# Patient Record
Sex: Female | Born: 1985 | Race: Asian | Hispanic: No | Marital: Single | Smoking: Never smoker
Health system: Southern US, Community
[De-identification: ages and names within clinical notes are randomized; demographics above are authoritative.]

## PROBLEM LIST (undated history)

## (undated) DIAGNOSIS — K2970 Gastritis, unspecified, without bleeding: Secondary | ICD-10-CM

## (undated) DIAGNOSIS — Z7722 Contact with and (suspected) exposure to environmental tobacco smoke (acute) (chronic): Secondary | ICD-10-CM

## (undated) DIAGNOSIS — R1013 Epigastric pain: Secondary | ICD-10-CM

## (undated) DIAGNOSIS — J309 Allergic rhinitis, unspecified: Secondary | ICD-10-CM

## (undated) DIAGNOSIS — K219 Gastro-esophageal reflux disease without esophagitis: Secondary | ICD-10-CM

## (undated) HISTORY — DX: Gastro-esophageal reflux disease without esophagitis: K21.9

## (undated) HISTORY — DX: Allergic rhinitis, unspecified: J30.9

## (undated) HISTORY — DX: Contact with and (suspected) exposure to environmental tobacco smoke (acute) (chronic): Z77.22

## (undated) HISTORY — PX: TOOTH EXTRACTION: SUR596

## (undated) HISTORY — DX: Gastritis, unspecified, without bleeding: K29.70

## (undated) HISTORY — DX: Epigastric pain: R10.13

---
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
## 2003-05-10 ENCOUNTER — Encounter: Payer: Self-pay | Admitting: Obstetrics and Gynecology

## 2003-05-10 ENCOUNTER — Ambulatory Visit (HOSPITAL_COMMUNITY): Admission: RE | Admit: 2003-05-10 | Discharge: 2003-05-10 | Payer: Self-pay | Admitting: Obstetrics and Gynecology

## 2003-06-06 ENCOUNTER — Encounter: Payer: Self-pay | Admitting: Obstetrics and Gynecology

## 2003-06-06 ENCOUNTER — Ambulatory Visit (HOSPITAL_COMMUNITY): Admission: RE | Admit: 2003-06-06 | Discharge: 2003-06-06 | Payer: Self-pay | Admitting: Obstetrics and Gynecology

## 2003-09-07 ENCOUNTER — Encounter: Admission: RE | Admit: 2003-09-07 | Discharge: 2003-09-07 | Payer: Self-pay | Admitting: Obstetrics and Gynecology

## 2003-09-11 ENCOUNTER — Encounter: Admission: RE | Admit: 2003-09-11 | Discharge: 2003-09-11 | Payer: Self-pay | Admitting: Obstetrics and Gynecology

## 2003-09-14 ENCOUNTER — Encounter: Admission: RE | Admit: 2003-09-14 | Discharge: 2003-09-14 | Payer: Self-pay | Admitting: Family Medicine

## 2003-09-18 ENCOUNTER — Encounter: Admission: RE | Admit: 2003-09-18 | Discharge: 2003-09-18 | Payer: Self-pay | Admitting: Obstetrics and Gynecology

## 2003-09-21 ENCOUNTER — Encounter: Admission: RE | Admit: 2003-09-21 | Discharge: 2003-09-21 | Payer: Self-pay | Admitting: *Deleted

## 2003-09-25 ENCOUNTER — Encounter: Admission: RE | Admit: 2003-09-25 | Discharge: 2003-09-25 | Payer: Self-pay | Admitting: Obstetrics and Gynecology

## 2003-09-26 ENCOUNTER — Inpatient Hospital Stay (HOSPITAL_COMMUNITY): Admission: AD | Admit: 2003-09-26 | Discharge: 2003-09-29 | Payer: Self-pay | Admitting: Internal Medicine

## 2004-10-23 ENCOUNTER — Emergency Department (HOSPITAL_COMMUNITY): Admission: EM | Admit: 2004-10-23 | Discharge: 2004-10-23 | Payer: Self-pay | Admitting: Family Medicine

## 2005-04-24 ENCOUNTER — Emergency Department (HOSPITAL_COMMUNITY): Admission: EM | Admit: 2005-04-24 | Discharge: 2005-04-24 | Payer: Self-pay | Admitting: Family Medicine

## 2005-05-13 ENCOUNTER — Emergency Department (HOSPITAL_COMMUNITY): Admission: EM | Admit: 2005-05-13 | Discharge: 2005-05-13 | Payer: Self-pay | Admitting: Family Medicine

## 2008-12-16 ENCOUNTER — Emergency Department (HOSPITAL_COMMUNITY): Admission: EM | Admit: 2008-12-16 | Discharge: 2008-12-16 | Payer: Self-pay | Admitting: Emergency Medicine

## 2010-02-27 ENCOUNTER — Ambulatory Visit: Payer: Self-pay | Admitting: Physician Assistant

## 2010-02-27 DIAGNOSIS — J309 Allergic rhinitis, unspecified: Secondary | ICD-10-CM | POA: Insufficient documentation

## 2010-03-14 ENCOUNTER — Ambulatory Visit: Payer: Self-pay | Admitting: Physician Assistant

## 2010-03-14 LAB — CONVERTED CEMR LAB
Cholesterol: 162 mg/dL (ref 0–200)
HDL: 81 mg/dL (ref >39)
LDL Cholesterol: 60 mg/dL (ref 0–99)
Triglycerides: 103 mg/dL (ref <150)

## 2010-03-15 ENCOUNTER — Encounter: Payer: Self-pay | Admitting: Physician Assistant

## 2010-04-16 ENCOUNTER — Ambulatory Visit: Payer: Self-pay | Admitting: Physician Assistant

## 2010-04-16 ENCOUNTER — Telehealth: Payer: Self-pay | Admitting: Physician Assistant

## 2010-08-20 ENCOUNTER — Emergency Department (HOSPITAL_COMMUNITY)
Admission: EM | Admit: 2010-08-20 | Discharge: 2010-08-20 | Payer: Self-pay | Source: Home / Self Care | Admitting: Emergency Medicine

## 2010-10-29 ENCOUNTER — Encounter (INDEPENDENT_AMBULATORY_CARE_PROVIDER_SITE_OTHER): Payer: Self-pay | Admitting: Internal Medicine

## 2010-10-29 DIAGNOSIS — R51 Headache: Secondary | ICD-10-CM | POA: Insufficient documentation

## 2010-10-29 DIAGNOSIS — R519 Headache, unspecified: Secondary | ICD-10-CM | POA: Insufficient documentation

## 2010-12-31 NOTE — Letter (Signed)
Summary: PT INFORMATION SHEET  PT INFORMATION SHEET   Imported By: Arta Bruce 05/03/2010 14:58:18  _____________________________________________________________________  External Attachment:    Type:   Image     Comment:   External Document

## 2010-12-31 NOTE — Assessment & Plan Note (Signed)
Summary: new medicaid pt/ first est care/ sinus infection//gk   Vital Signs:  Patient profile:   25 year old female Weight:      115.19 pounds Temp:     97.3 degrees F Pulse rate:   89 / minute Pulse rhythm:   regular Resp:     16 per minute BP sitting:   111 / 77  (right arm) Cuff size:   regular  Vitals Entered By: Chauncy Passy, SMA  CC: NP- Pt. is here for a poss. sinus infection. Pt. states her ears are buzzing and sometimes hurt. Pt. states she has pain under her eyes that will radiate towards the back of her head. This has been present for about a year or more. Also, having hard time breathing when sleeping.  Is Patient Diabetic? No Pain Assessment Patient in pain? yes     Location: Head/Ears Intensity: 7/6 Type: sharp  Does patient need assistance? Functional Status Self care Ambulation Normal   Primary Care Provider:  Tereso Newcomer, PA-C  CC:  NP- Pt. is here for a poss. sinus infection. Pt. states her ears are buzzing and sometimes hurt. Pt. states she has pain under her eyes that will radiate towards the back of her head. This has been present for about a year or more. Also and having hard time breathing when sleeping. Marland Kitchen  History of Present Illness: New patient. No regular medical care in the past.  Health maint: Last pap done with GYN (Dr. Tracey Harries).  Thinks she has sinus infection.   Went to urgent care 1 1/2 mos ago.   She was given several prescriptions. . . somewhat vague about her hx. Sounds like she had antibx's.    Has had symptoms for over a year. Headache posterior. + frontal sinus pressure Has daily symptoms + nasal congestion No fever No sneezing + itchy eyes + sore throat + has to clear throat + cough - mainly nonproductive NO hemoptysis  Very vague about symptoms.  When asked how long she has had these symptoms, she says, "I don't know." Sounds like she has had problems since she moved to [place] from [place].  Habits &  Providers  Alcohol-Tobacco-Diet     Tobacco Status: never  Exercise-Depression-Behavior     Drug Use: no  Current Medications (verified): 1)  None  Allergies (verified): No Known Drug Allergies  Past History:  Past Medical History: Unremarkable  Past Surgical History: Denies surgical history  Family History: Family History Hypertension - Dad  Social History: G1P1 1 child Never Smoked Occ ETOH Drug use-no Unemployed From [employer] Status:  never Drug Use:  no  Review of Systems      See HPI CV:  Denies shortness of breath with exertion. Resp:  Denies coughing up blood and wheezing.  Physical Exam  General:  alert, well-developed, and well-nourished.   Head:  normocephalic and atraumatic.   Eyes:  pupils equal, pupils round, pupils reactive to light, and no optic disk abnormalities.   Ears:  R ear normal and L ear normal.   Nose:  no nasal polyps, mucosal erythema, and mucosal edema.   Mouth:  pharynx pink and moist, no erythema, and no exudates.   Neck:  supple, no thyromegaly, and no cervical lymphadenopathy.   Lungs:  normal breath sounds, no crackles, and no wheezes.   Heart:  normal rate and regular rhythm.   Extremities:  no edema Neurologic:  alert & oriented X3 and cranial nerves II-XII intact.  Skin:  turgor normal.   Psych:  normally interactive.     Impression & Recommendations:  Problem # 1:  ALLERGIC RHINITIS (ICD-477.9)  sounds like she has gone back and forth to urgent cares and has had several rounds of antibiotics I do not think she needs antibx's at this time I think she has significant allergic rhinitis she has not really followed through with the treatment that has been rx'd to her in the past I advised her to get on an antihistamine/decongestant combo + flonase and she should take daily  she should use hypoallergenic soaps and detergents and avoid animal fur in her bed linens/pillows. f/u in 6-8 weeks if no improvement, may  need to consider referral to ENT or allergy for allergy testing also, consider sinus CT if no improvement   Her updated medication list for this problem includes:    Flonase 50 Mcg/act Susp (Fluticasone propionate) .Marland Kitchen... 2 sprays each nostril once daily (use for 3 weeks, then take one week off each month)  Problem # 2:  Preventive Health Care (ICD-V70.0) bring back for cholesterol screen update Td today has GYN exams with Ob/Gyn  Complete Medication List: 1)  Zyrtec-d Allergy & Congestion 5-120 Mg Xr12h-tab (Cetirizine-pseudoephedrine) .... Take 1 tablet by mouth two times a day 2)  Flonase 50 Mcg/act Susp (Fluticasone propionate) .... 2 sprays each nostril once daily (use for 3 weeks, then take one week off each month)  Other Orders: Tdap => 56yrs IM [phone]) Admin 1st Vaccine (60454) Admin 1st Vaccine [facility]) [phone])  Patient Instructions: 1)  Td shot today. 2)  Schedule lab visit for fasting lipids.  Do not eat or drink anything after midnight the night before except water. 3)  Please schedule a follow-up appointment in 6-8 weeks with Lorin Picket for allergies or return sooner if you feel worse. 4)  Use ibuprofen for 3-4 days straight, then as needed. 5)  Take 400-600 mg of Ibuprofen (Advil, Motrin) with food every 8 hours as needed  for relief of pain or comfort of fever.  6)  You can also use tylenol as needed. 7)  Take 650 - 1000 mg of tylenol every 4-6 hours as needed for relief of pain or comfort of fever. Avoid taking more than 4000 mg in a 24 hour period( can cause liver damage in higher doses).  Prescriptions: FLONASE 50 MCG/ACT SUSP (FLUTICASONE PROPIONATE) 2 sprays each nostril once daily (use for 3 weeks, then take one week off each month)  #1 bottle x 3   Entered and Authorized by:   Tereso Newcomer PA-C   Signed by:   Tereso Newcomer PA-C on 02/27/2010   Method used:   Print then Give to Patient   RxID:   [id number] ZYRTEC-D ALLERGY & CONGESTION 5-120 MG XR12H-TAB  (CETIRIZINE-PSEUDOEPHEDRINE) Take 1 tablet by mouth two times a day  #60 x 3   Entered and Authorized by:   Tereso Newcomer PA-C   Signed by:   Tereso Newcomer PA-C on 02/27/2010   Method used:   Print then Give to Patient   RxID:   [id number]    Tetanus/Td Vaccine    Vaccine Type: Tdap    Site: left deltoid    Mfr: Sanofi Pasteur    Dose: 0.5 ml    Route: IM    Given by: Chauncy Passy, SMA    Exp. Date: 03/07/2012    Lot #: [id number]    VIS given: 10/19/07 version given February 27, 2010.

## 2010-12-31 NOTE — Letter (Signed)
Summary: *HSN Results Follow up  [facility]  [address] [place], [place] [address]   Phone: [phone]  Fax: [phone]      03/15/2010   AN H [address] [place], [place]  [address]   Dear  Ms. AN H,                            ____S.Drinkard,FNP   ____D. Gore,FNP       ____B. McPherson,MD   ____V. Rankins,MD    ____E. Mulberry,MD    ____N. Daphine Deutscher, FNP  ____D. Reche Dixon, MD    ____K. Philipp Deputy, MD    __x__S. Alben Spittle, PA-C     This letter is to inform you that your recent test(s):  _______Pap Smear    ___x____Lab Test     _______X-ray    ___x____ is within acceptable limits  _______ requires a medication change  _______ requires a follow-up lab visit  _______ requires a follow-up visit with your provider   Comments:       _________________________________________________________ If you have any questions, please contact our office                     Sincerely,  Tereso Newcomer PA-C [facility]

## 2010-12-31 NOTE — Assessment & Plan Note (Signed)
Summary: 6-8 WEEK FU////KT   Vital Signs:  Patient profile:   25 year old female Weight:      116 pounds Temp:     98.2 degrees F oral Pulse rate:   75 / minute Resp:     19 per minute BP sitting:   115 / 76  (left arm) CC: follow-up visit, no other issue, sinus issue not getting better, med. not helping, stay stopped-up Is Patient Diabetic? No Pain Assessment Patient in pain? no       Does patient need assistance? Functional Status Self care Ambulation Normal   Primary Care Provider:  Tereso Newcomer, PA-C  CC:  follow-up visit, no other issue, sinus issue not getting better, med. not helping, and stay stopped-up.  History of Present Illness: Here for f/u. Taking Zyrtec-D and Flonase every day. States no relief.  Still has a lot of sinus pressure and posterior headache.  C/o tinnitus in both ears.  Notes some pressure in ears.  Does note sore throat.  No smoking.  Feels like she gets choked at times.  No dysphagia.  No dyspnea.  Still has a lot of nasal congestion.  No dental pain.  No change in taste or smell.  No vertiginous symptoms.  No loss of hearing.  On f/u of hx from first visit, she notes being given antibiotics once or twice.  She did take them all and never felt any better with them.   Problems Prior to Update: 1)  Preventive Health Care  (ICD-V70.0) 2)  Allergic Rhinitis  (ICD-477.9)  Current Medications (verified): 1)  Zyrtec-D Allergy & Congestion 5-120 Mg Xr12h-Tab (Cetirizine-Pseudoephedrine) .... Take 1 Tablet By Mouth Two Times A Day 2)  Flonase 50 Mcg/act Susp (Fluticasone Propionate) .... 2 Sprays Each Nostril Once Daily (Use For 3 Weeks, Then Take One Week Off Each Month)  Allergies (verified): No Known Drug Allergies  Physical Exam  General:  alert, well-developed, and well-nourished.   Head:  normocephalic and atraumatic.   Eyes:  pupils equal, pupils round, and pupils reactive to light.   Ears:  R ear normal and L ear normal.   Nose:  no  external deformity, no nasal discharge, and mucosal edema.   Mouth:  pharynx pink and moist, no erythema, and no exudates.   Neck:  supple and no cervical lymphadenopathy.   Lungs:  normal breath sounds, no crackles, and no wheezes.   Heart:  normal rate and regular rhythm.   Neurologic:  alert & oriented X3 and cranial nerves II-XII intact.   Psych:  normally interactive.     Impression & Recommendations:  Problem # 1:  ALLERGIC RHINITIS (ICD-477.9)  significant symptoms of congestion do not feel she needs antibiotics at this point she had multiple rounds of antibiotics before I saw her and she did not improve she is now on Zyrtec-D and flonase without change I do not believe she is describing anything else (ie . Marland Kitchen .I do not think she has migraines) refer to ENT will get her to use saline nose spray and mucinex to see if this helps  Her updated medication list for this problem includes:    Flonase 50 Mcg/act Susp (Fluticasone propionate) .Marland Kitchen... 2 sprays each nostril once daily (use for 3 weeks, then take one week off each month)    Cvs Saline Nose Spray 0.65 % Soln (Saline) ..... Use as needed . . . do not use at same time as flonase  Orders: ENT Referral (ENT)  Complete  Medication List: 1)  Zyrtec-d Allergy & Congestion 5-120 Mg Xr12h-tab (Cetirizine-pseudoephedrine) .... Take 1 tablet by mouth two times a day 2)  Flonase 50 Mcg/act Susp (Fluticasone propionate) .... 2 sprays each nostril once daily (use for 3 weeks, then take one week off each month) 3)  Cvs Saline Nose Spray 0.65 % Soln (Saline) .... Use as needed . . . do not use at same time as flonase 4)  Mucinex 600 Mg Xr12h-tab (Guaifenesin) .... Take 1 tablet by mouth two times a day  Patient Instructions: 1)  Get Mucinex and Saline Nose Spray over the counter. 2)  Someone will call you about an appointment with the ENT.

## 2010-12-31 NOTE — Progress Notes (Signed)
Summary: ENT referral  Phone Note Outgoing Call   Summary of Call: Needs ENT referral for sinusitis.  Letter in system. Initial call taken by: Brynda Rim,  Apr 16, 2010 4:24 PM

## 2010-12-31 NOTE — Letter (Signed)
Summary: *Referral Letter  [facility]  [address] [place], [place] [address]   Phone: [phone]  Fax: [phone]    04/16/2010  Thank you in advance for agreeing to see my patient:  An H [address] [place], [place]  [address]  Phone: [phone]  Reason for Referral: 25 yo female with allergic rhinitis and sinus congestion.  She has been on multiple rounds of antibiotics before establishing with me with no improvement.  I put her on Zyrtec-D and Flonase.  She has had no benfit from this and continues to have significant sinus pain and pressure, tinnitus and headaches.  Please evaluate.  Procedures Requested:   Current Medical Problems: 1)  PREVENTIVE HEALTH CARE (ICD-V70.0) 2)  ALLERGIC RHINITIS (ICD-477.9)   Current Medications: 1)  ZYRTEC-D ALLERGY & CONGESTION 5-120 MG XR12H-TAB (CETIRIZINE-PSEUDOEPHEDRINE) Take 1 tablet by mouth two times a day 2)  FLONASE 50 MCG/ACT SUSP (FLUTICASONE PROPIONATE) 2 sprays each nostril once daily (use for 3 weeks, then take one week off each month) 3)  CVS SALINE NOSE SPRAY 0.65 % SOLN (SALINE) use as needed . . . do not use at same time as Flonase 4)  MUCINEX 600 MG XR12H-TAB (GUAIFENESIN) Take 1 tablet by mouth two times a day   Past Medical History: 1)  Unremarkable   Prior History of Blood Transfusions:   Pertinent Labs:    Thank you again for agreeing to see our patient; please contact us if you have any further questions or need additional information.  Sincerely,  Tereso Newcomer PA-C

## 2011-01-27 ENCOUNTER — Encounter (INDEPENDENT_AMBULATORY_CARE_PROVIDER_SITE_OTHER): Payer: Self-pay | Admitting: Internal Medicine

## 2011-02-06 NOTE — Letter (Signed)
Summary: [facility]   ENT   Imported By: Arta Bruce 01/28/2011 10:43:10  _____________________________________________________________________  External Attachment:    Type:   Image     Comment:   External Document

## 2011-02-06 NOTE — Miscellaneous (Signed)
Summary: [facility] ENT record review  Clinical Lists Changes  Problems: Added new problem of HEADACHE (ICD-784.0) - Evaluated by [facility] ENT, Aquilla Hacker, PA-C.  Musculoskeletal vs. Neurologic.   Recommended referral to Neurology--pt to let her know. No imaging as pt pregnant

## 2011-02-13 LAB — URINALYSIS, ROUTINE W REFLEX MICROSCOPIC
Hgb urine dipstick: NEGATIVE
Ketones, ur: NEGATIVE mg/dL
Protein, ur: NEGATIVE mg/dL
Urobilinogen, UA: 0.2 mg/dL (ref 0.0–1.0)
pH: 7.5 (ref 5.0–8.0)

## 2011-02-13 LAB — URINE MICROSCOPIC-ADD ON

## 2011-03-17 LAB — CBC
HCT: 35.3 % — ABNORMAL LOW (ref 36.0–46.0)
Hemoglobin: 11.7 g/dL — ABNORMAL LOW (ref 12.0–15.0)
MCHC: 33.3 g/dL (ref 30.0–36.0)
MCV: 73 fL — ABNORMAL LOW (ref 78.0–100.0)
Platelets: 395 10*3/uL (ref 150–400)
RBC: 4.84 MIL/uL (ref 3.87–5.11)
RDW: 14.4 % (ref 11.5–15.5)
WBC: 9.8 10*3/uL (ref 4.0–10.5)

## 2011-03-17 LAB — COMPREHENSIVE METABOLIC PANEL
ALT: 21 U/L (ref 0–35)
AST: 23 U/L (ref 0–37)
Albumin: 4 g/dL (ref 3.5–5.2)
Alkaline Phosphatase: 34 U/L — ABNORMAL LOW (ref 39–117)
BUN: 6 mg/dL (ref 6–23)
CO2: 24 mEq/L (ref 19–32)
Calcium: 8.8 mg/dL (ref 8.4–10.5)
Chloride: 101 mEq/L (ref 96–112)
Creatinine, Ser: 0.65 mg/dL (ref 0.4–1.2)
GFR calc Af Amer: >60 mL/min (ref >60)
GFR calc non Af Amer: >60 mL/min (ref >60)
Glucose, Bld: 85 mg/dL (ref 70–99)
Potassium: 3.6 mEq/L (ref 3.5–5.1)
Sodium: 133 mEq/L — ABNORMAL LOW (ref 135–145)
Total Bilirubin: 0.6 mg/dL (ref 0.3–1.2)
Total Protein: 7.9 g/dL (ref 6.0–8.3)

## 2011-03-17 LAB — DIFFERENTIAL
Basophils Absolute: 0 10*3/uL (ref 0.0–0.1)
Basophils Relative: 0 % (ref 0–1)
Eosinophils Absolute: 0.1 10*3/uL (ref 0.0–0.7)
Eosinophils Relative: 1 % (ref 0–5)
Lymphocytes Relative: 31 % (ref 12–46)
Lymphs Abs: 3 10*3/uL (ref 0.7–4.0)
Monocytes Absolute: 0.7 10*3/uL (ref 0.1–1.0)
Monocytes Relative: 7 % (ref 3–12)
Neutro Abs: 6 10*3/uL (ref 1.7–7.7)
Neutrophils Relative %: 61 % (ref 43–77)

## 2011-03-17 LAB — URINALYSIS, ROUTINE W REFLEX MICROSCOPIC
Bilirubin Urine: NEGATIVE
Glucose, UA: NEGATIVE mg/dL
Ketones, ur: NEGATIVE mg/dL
Leukocytes, UA: NEGATIVE
Nitrite: NEGATIVE
Protein, ur: NEGATIVE mg/dL
Specific Gravity, Urine: 1.004 — ABNORMAL LOW (ref 1.005–1.030)
Urobilinogen, UA: 0.2 mg/dL (ref 0.0–1.0)
pH: 6.5 (ref 5.0–8.0)

## 2011-03-17 LAB — POCT PREGNANCY, URINE: Preg Test, Ur: NEGATIVE

## 2011-03-17 LAB — URINE MICROSCOPIC-ADD ON

## 2011-03-17 LAB — LIPASE, BLOOD: Lipase: 27 U/L (ref 11–59)

## 2011-03-30 ENCOUNTER — Inpatient Hospital Stay (HOSPITAL_COMMUNITY)
Admission: AD | Admit: 2011-03-30 | Discharge: 2011-04-02 | DRG: 775 | Disposition: A | Discharge status: Home / Self Care | Payer: Medicaid Other | Source: Ambulatory Visit | Attending: Obstetrics and Gynecology | Admitting: Obstetrics and Gynecology

## 2011-03-31 LAB — CBC
HCT: 31 % — ABNORMAL LOW (ref 36.0–46.0)
Hemoglobin: 10.2 g/dL — ABNORMAL LOW (ref 12.0–15.0)
MCH: 23.9 pg — ABNORMAL LOW (ref 26.0–34.0)
MCHC: 32.9 g/dL (ref 30.0–36.0)

## 2011-04-01 LAB — CBC
MCH: 23.8 pg — ABNORMAL LOW (ref 26.0–34.0)
MCV: 73.3 fL — ABNORMAL LOW (ref 78.0–100.0)
Platelets: 246 10*3/uL (ref 150–400)
RDW: 14.1 % (ref 11.5–15.5)
WBC: 13.3 10*3/uL — ABNORMAL HIGH (ref 4.0–10.5)

## 2011-04-03 ENCOUNTER — Encounter: Payer: Self-pay | Admitting: Physician Assistant

## 2011-04-08 NOTE — Discharge Summary (Signed)
NAME:  Brenda Krause, Brenda Krause                        ACCOUNT NO.:  [id number]  MEDICAL RECORD NO.:  [id number]           PATIENT TYPE:  LOCATION:                                 FACILITY:  PHYSICIAN:  Sherron Monday, MD        DATE OF BIRTH:  [birth year]  DATE OF ADMISSION:  03/31/2011 DATE OF DISCHARGE:  04/02/2011                              DISCHARGE SUMMARY   ADMITTING DIAGNOSIS:  Intrauterine pregnancy with spontaneous rupture of membranes.  DISCHARGE DIAGNOSIS:  Intrauterine pregnancy with spontaneous rupture of membranes, delivered.  HISTORY OF PRESENT ILLNESS:  A 25 year old G2, P1-0-0-1 at 35 plus weeks with spontaneous rupture of membranes at 7:00 p.m. for clear fluid documented in MAU after ambulation.  She states she has had good fetal movement, no vaginal bleeding, and contractions that are increasing in intensity and frequency.  Pregnancy is dated by 10-week ultrasound. Pregnancy was complicated by Chlamydia.  Other test of cure was negative.  PAST MEDICAL HISTORY:  Not significant.  PAST SURGICAL HISTORY:  Not significant.  PAST OB/GYN HISTORY:  G1 was a 5 pound 9 ounce female infant.  G2 is the present, history of Brenda Krause abnormal Pap smear with colposcopy.  Chlamydia in the pregnancy was tested and negative.  MEDICATIONS:  None.  ALLERGIES:  No known drug allergies.  No latex allergies.  SOCIAL HISTORY:  Denies alcohol, tobacco, or drug use.  FAMILY HISTORY:  Significant hypertension in the father.  PRENATAL LABS:  B+, antibody screen negative.  Hemoglobin 10.8, Pap within normal limits, rubella immune, RPR nonreactive, hepatitis C surface antigen negative, HIV negative, platelets 362,000, gonorrhea negative, Chlamydia positive with test of cure negative.  Cystic fibrosis screen negative.  AFP within normal limits.  First trimester screen within normal limits.  Ultrasound within normal limits, female infant.  Glucola 87.  Group B strep was negative.  Ultrasound  confirmed normal anatomy, posterior placenta, and a female infant.  On Brenda Krause early ultrasound, right choroid plexus cyst was noted.  PHYSICAL EXAMINATION:  Afebrile.  Vital signs stable with benign exam. Fetal heart tones within the 130s and reactive.  The cervix was 3 cm dilated and 100% effaced, and 0 station.  She was given Pitocin as needed to augment, however, she progressed rapidly to complete, complete, +2, pushed for 3-4 contractions to deliver a viable female infant at 5:05 a.m. with Apgar's of 9 at 1 minute and 9 at 5 minutes with weight of 6 pounds 4 ounces.  Placenta was expressed intact.  She had a periurethral laceration repaired with 3-0 Vicryl Rapide in typical fashion.  EBL was less than 500.  Her postpartum course was relatively uncomplicated.  Hemoglobin decreased from 10.2-9.0.  She is ambulating, voiding, tolerating a diet.  Her pain was well controlled.  She had normal lochia.  She was discharged to home on postpartum day #2 with routine discharge instructions, numbers to call if any questions or problems as well as prescriptions for Motrin, Vicodin, prenatal vitamins.  She is B+, rubella immune.  She is breast-feeding and will discuss contraception at next  checkup.     Sherron Monday, MD     JB/MEDQ  D:  04/02/2011  T:  04/02/2011  Job:  [id number]  Electronically Signed by Sherron Monday MD on 04/08/2011 08:51:58 AM

## 2011-04-18 NOTE — Discharge Summary (Signed)
NAME:  H, AN                                    ACCOUNT NO.:  [id number]   MEDICAL RECORD NO.:  [id number]                   PATIENT TYPE:   LOCATION:                                       FACILITY:   PHYSICIAN:  Malachi Pro. Ambrose Mantle, M.D.              DATE OF BIRTH:   DATE OF ADMISSION:  09/27/2003  DATE OF DISCHARGE:  09/29/2003                                 DISCHARGE SUMMARY   HISTORY OF PRESENT ILLNESS:  A 25 year old Asian married female para 0,  gravida 1, last period January 09, 2003, [facility] October 17, 2003 by dates and  October 08, 2003 by ultrasound, admitted in active labor.  Blood group and  type B positive, negative antibody, nonreactive serology, rubella immune,  hepatitis B surface antigen negative, HIV negative, GC and Chlamydia  negative, triple screen negative, one-hour Glucola 57, group B strep  negative.  Vaginal ultrasound on March 20, 2003 crown-rump length 4.30 cm,  11 weeks 1 day, [facility] October 08, 2003.  Repeat ultrasound on May 11, 2003  average gestational age [redacted] weeks 3 days, [facility] October 08, 2003.  The patient  was advised to gain weight and to take iron for anemia.  On September 06, 2003  ultrasound for size less than dates showed an estimated fetal weight of 2302  grams the 13th percentile for 35 weeks.  AFI was 12.1, nonstress test has  been reactive.  The patient began contracting on September 26, 2003.  She came  to MAU and the cervix was 3-4 cm dilated.  She was admitted, received an  epidural, and rapidly progressed to full dilatation.   PAST MEDICAL HISTORY:  Revealed no known allergies, no operations, no  illnesses.   FAMILY HISTORY:  Negative.   HABITS:  Alcohol, tobacco and drugs none.   HOSPITAL COURSE:  On admission her vital signs were normal.  The heart was  normal, lungs clear to P&A, abdomen soft.  Fundal height 33 cm.  On September 21, 2003 fetal heart tones normal.  Cervix 10 cm, membranes intact, vertex  at a +1 to +2 station.  Amniotomy was  done with clear fluid.  The patient  pushed well and brought the vertex to the perineum.  A silver dollar of  caput was showing and fetal heart rate decelerated to less than 100 beats  per minute for more than 2 minutes.  There was some response to scalp  stimulation.  O2 was given.  A Kiwi cup vacuum was placed and the baby  delivered with one push.  The infant was female, 5 pounds 9 ounces, Apgars 9  at one and 9 at five minutes, placenta intact, uterus normal.  A second-  degree left vaginal and left labial laceration repaired with 2-0 Vicryl.  Blood loss about 400 mL.  Postpartum the patient did well and was discharged  on the second postpartum day.  Initial hemoglobin 10.7, hematocrit 31.8,  white count 13,400, platelet count 392,000.  Follow up hemoglobin 9,  hematocrit 25.9.  RPR nonreactive.   FINAL DIAGNOSES:  1. Intrauterine pregnancy at 38+ weeks, delivered occiput anterior.  2. Terminal bradycardia.   OPERATION:  Vacuum-assisted vaginal delivery, repair of left vaginal and  left labial lacerations.   FINAL CONDITION:  Improved.    INSTRUCTIONS:  Include our regular discharge instruction booklet.  The  patient is advised to return to the office in six weeks for follow up  examination.  She is given a prescription for Percocet 5/325 mg, dispense  #20 tablets, one every 4-6 hours as needed for pain.                                               Malachi Pro. Ambrose Mantle, M.D.    TFH/MEDQ  D:  09/29/2003  T:  09/29/2003  Job:  [id number]

## 2011-07-19 ENCOUNTER — Emergency Department (HOSPITAL_COMMUNITY)
Admission: EM | Admit: 2011-07-19 | Discharge: 2011-07-19 | Disposition: A | Discharge status: Home / Self Care | Payer: Medicaid Other | Attending: Emergency Medicine | Admitting: Emergency Medicine

## 2011-07-19 DIAGNOSIS — K089 Disorder of teeth and supporting structures, unspecified: Secondary | ICD-10-CM | POA: Insufficient documentation

## 2011-07-23 ENCOUNTER — Emergency Department (HOSPITAL_COMMUNITY)
Admission: EM | Admit: 2011-07-23 | Discharge: 2011-07-23 | Disposition: A | Discharge status: Home / Self Care | Payer: Medicaid Other | Attending: Emergency Medicine | Admitting: Emergency Medicine

## 2011-07-23 DIAGNOSIS — K089 Disorder of teeth and supporting structures, unspecified: Secondary | ICD-10-CM | POA: Insufficient documentation

## 2013-03-26 ENCOUNTER — Emergency Department (HOSPITAL_COMMUNITY)
Admission: EM | Admit: 2013-03-26 | Discharge: 2013-03-26 | Disposition: A | Discharge status: Home / Self Care | Payer: Medicaid Other | Attending: Emergency Medicine | Admitting: Emergency Medicine

## 2013-03-26 ENCOUNTER — Encounter (HOSPITAL_COMMUNITY): Payer: Self-pay | Admitting: Physical Medicine and Rehabilitation

## 2013-03-26 DIAGNOSIS — K2970 Gastritis, unspecified, without bleeding: Secondary | ICD-10-CM

## 2013-03-26 DIAGNOSIS — R197 Diarrhea, unspecified: Secondary | ICD-10-CM | POA: Insufficient documentation

## 2013-03-26 DIAGNOSIS — K2990 Gastroduodenitis, unspecified, without bleeding: Secondary | ICD-10-CM | POA: Insufficient documentation

## 2013-03-26 LAB — COMPREHENSIVE METABOLIC PANEL
ALT: 17 U/L (ref 0–35)
AST: 18 U/L (ref 0–37)
Calcium: 9.1 mg/dL (ref 8.4–10.5)
Glucose, Bld: 98 mg/dL (ref 70–99)
Sodium: 141 mEq/L (ref 135–145)
Total Protein: 7.6 g/dL (ref 6.0–8.3)

## 2013-03-26 LAB — CBC WITH DIFFERENTIAL/PLATELET
Eosinophils Absolute: 0 10*3/uL (ref 0.0–0.7)
MCH: 23.8 pg — ABNORMAL LOW (ref 26.0–34.0)
MCHC: 34.5 g/dL (ref 30.0–36.0)
Monocytes Absolute: 0.6 10*3/uL (ref 0.1–1.0)
Neutrophils Relative %: 57 % (ref 43–77)
Platelets: 321 10*3/uL (ref 150–400)

## 2013-03-26 LAB — LIPASE, BLOOD: Lipase: 36 U/L (ref 11–59)

## 2013-03-26 MED ORDER — OMEPRAZOLE 20 MG PO CPDR: 20.0000 mg | DELAYED_RELEASE_CAPSULE | Freq: Every day | ORAL | Status: DC

## 2013-03-26 MED ORDER — GI COCKTAIL ~~LOC~~
30.0000 mL | Freq: Once | ORAL | Status: AC
  Administered 2013-03-26: 30 mL via ORAL
  Filled 2013-03-26: qty 30

## 2013-03-26 MED ORDER — HYDROCODONE-ACETAMINOPHEN 5-325 MG PO TABS: 1.0000 | ORAL_TABLET | ORAL | Status: DC | PRN

## 2013-03-26 MED ORDER — PANTOPRAZOLE SODIUM 40 MG PO TBEC
40.0000 mg | DELAYED_RELEASE_TABLET | Freq: Every day | ORAL | Status: DC
  Administered 2013-03-26: 40 mg via ORAL
  Filled 2013-03-26: qty 1

## 2013-03-26 MED ORDER — HYDROCODONE-ACETAMINOPHEN 5-325 MG PO TABS: 1.0000 | ORAL_TABLET | Freq: Once | ORAL | Status: DC

## 2013-03-26 MED ORDER — FAMOTIDINE 20 MG PO TABS
20.0000 mg | ORAL_TABLET | Freq: Once | ORAL | Status: AC
  Administered 2013-03-26: 20 mg via ORAL
  Filled 2013-03-26: qty 1

## 2013-03-26 NOTE — ED Notes (Signed)
PT reports she is feeling a little better since GI cocktail.

## 2013-03-26 NOTE — ED Provider Notes (Signed)
Medical screening examination/treatment/procedure(s) were performed by non-physician practitioner and as supervising physician I was immediately available for consultation/collaboration.  Brenda Krause. Rubin Payor, MD 03/26/13 [phone]

## 2013-03-26 NOTE — ED Provider Notes (Signed)
History     CSN: [id number]  Arrival date & time 03/26/13  7846   First MD Initiated Contact with Patient 03/26/13 [phone]      Chief Complaint  Patient presents with  . Abdominal Pain    (Consider location/radiation/quality/duration/timing/severity/associated sxs/prior treatment) Patient is a 27 y.o. female presenting with abdominal pain. The history is provided by the patient.  Abdominal Pain Pain location:  Epigastric Pain quality: sharp and stabbing   Pain severity:  Moderate Relieved by:  Nothing Associated symptoms: diarrhea   Associated symptoms: no chills, no fever, no nausea and no vomiting   Associated symptoms comment:  Sharp, non-radiating epigastric pain that has been constant for the past 3 days. No fever, N, V. She reports 1-2 loose bowel movements per day. She has not taken anything for the symptoms. No worse with eating, position or belching. She reports frequent use of NSAIDs.   No past medical history on file.  No past surgical history on file.  No family history on file.  History  Substance Use Topics  . Smoking status: Never Smoker   . Smokeless tobacco: Not on file  . Alcohol Use: No    OB History   Grav Para Term Preterm Abortions TAB SAB Ect Mult Living                  Review of Systems  Constitutional: Negative for fever and chills.  Respiratory: Negative.   Cardiovascular: Negative.   Gastrointestinal: Positive for abdominal pain and diarrhea. Negative for nausea and vomiting.  Skin: Negative.  Negative for rash.  Neurological: Negative.  Negative for weakness and light-headedness.    Allergies  Review of patient's allergies indicates no known allergies.  Home Medications   Current Outpatient Rx  Name  Route  Sig  Dispense  Refill  . Alum & Mag Hydroxide-Simeth (ANTACID LIQUID PO)   Oral   Take 20 mLs by mouth once.           BP 111/77  Pulse 76  Temp(Src) 97 F (36.1 C) (Oral)  SpO2 99%  Physical Exam   Constitutional: She is oriented to person, place, and time. She appears well-developed and well-nourished.  HENT:  Head: Normocephalic.  Neck: Normal range of motion. Neck supple.  Cardiovascular: Normal rate and regular rhythm.   Pulmonary/Chest: Effort normal and breath sounds normal.  Abdominal: Soft. Bowel sounds are normal. There is tenderness. There is no rebound and no guarding.  Tenderness limited to epigastric region. No mass. BS hypoactive. No distention.  Musculoskeletal: Normal range of motion.  Neurological: She is alert and oriented to person, place, and time.  Skin: Skin is warm and dry. No rash noted.  Psychiatric: She has a normal mood and affect.    ED Course  Procedures (including critical care time)  Labs Reviewed  CBC WITH DIFFERENTIAL  COMPREHENSIVE METABOLIC PANEL  LIPASE, BLOOD   Results for orders placed during the hospital encounter of 03/26/13  CBC WITH DIFFERENTIAL      Result Value Range   WBC 7.7  4.0 - 10.5 K/uL   RBC 4.92  3.87 - 5.11 MIL/uL   Hemoglobin 11.7 (*) 12.0 - 15.0 g/dL   HCT 52.8 (*) 41.3 - 24.4 %   MCV 68.9 (*) 78.0 - 100.0 fL   MCH 23.8 (*) 26.0 - 34.0 pg   MCHC 34.5  30.0 - 36.0 g/dL   RDW 01.0  27.2 - 53.6 %   Platelets 321  150 - 400  K/uL   Neutrophils Relative 57  43 - 77 %   Lymphocytes Relative 34  12 - 46 %   Monocytes Relative 8  3 - 12 %   Eosinophils Relative 0  0 - 5 %   Basophils Relative 1  0 - 1 %   Neutro Abs 4.4  1.7 - 7.7 K/uL   Lymphs Abs 2.6  0.7 - 4.0 K/uL   Monocytes Absolute 0.6  0.1 - 1.0 K/uL   Eosinophils Absolute 0.0  0.0 - 0.7 K/uL   Basophils Absolute 0.1  0.0 - 0.1 K/uL   WBC Morphology ATYPICAL LYMPHOCYTES    COMPREHENSIVE METABOLIC PANEL      Result Value Range   Sodium 141  135 - 145 mEq/L   Potassium 3.9  3.5 - 5.1 mEq/L   Chloride 107  96 - 112 mEq/L   CO2 25  19 - 32 mEq/L   Glucose, Bld 98  70 - 99 mg/dL   BUN 18  6 - 23 mg/dL   Creatinine, Ser 1.61  0.50 - 1.10 mg/dL   Calcium 9.1   8.4 - 09.6 mg/dL   Total Protein 7.6  6.0 - 8.3 g/dL   Albumin 3.6  3.5 - 5.2 g/dL   AST 18  0 - 37 U/L   ALT 17  0 - 35 U/L   Alkaline Phosphatase 50  39 - 117 U/L   Total Bilirubin 0.1 (*) 0.3 - 1.2 mg/dL   GFR calc non Af Amer >90  >90 mL/min   GFR calc Af Amer >90  >90 mL/min  LIPASE, BLOOD      Result Value Range   Lipase 36  11 - 59 U/L    No results found.   No diagnosis found.  1. Gastritis   MDM  9:20: She feels some relief with GI cocktail. Still having pain. Labs pending. 10:00:  Labs resulted without significant abnormality. Discomfort limited to epigastrium that is some better with GI Cocktail supporting diagnosis of gastritis, possibly from NSAID overuse. She is stable for discharge.        Arnoldo Hooker, PA-C 03/26/13 1004

## 2013-03-26 NOTE — ED Notes (Signed)
Pt presents to department for evaluation of epigastric pain. Ongoing x4 days. Also states loose stools and frequent burping. 7/10 pain at the time. Denies urinary symptoms. Pt is alert and oriented x4.

## 2013-03-26 NOTE — ED Notes (Signed)
PT ambulated with baseline gait; VSS; A&Ox3; no signs of distress; respirations even and unlabored; skin warm and dry; no questions upon discharge.  

## 2013-05-23 ENCOUNTER — Ambulatory Visit: Payer: Medicaid Other | Attending: Family Medicine | Admitting: Internal Medicine

## 2013-05-23 VITALS — BP 98/64 | HR 78 | Temp 98.3°F | Resp 16 | Wt 142.0 lb

## 2013-05-23 DIAGNOSIS — J309 Allergic rhinitis, unspecified: Secondary | ICD-10-CM

## 2013-05-23 MED ORDER — MONTELUKAST SODIUM 10 MG PO TABS: 10.0000 mg | ORAL_TABLET | Freq: Every day | ORAL | Status: DC

## 2013-05-23 NOTE — Patient Instructions (Addendum)
Sore Throat A sore throat is pain, burning, irritation, or scratchiness of the throat. There is often pain or tenderness when swallowing or talking. A sore throat may be accompanied by other symptoms, such as coughing, sneezing, fever, and swollen neck glands. A sore throat is often the first sign of another sickness, such as a cold, flu, strep throat, or mononucleosis (commonly known as mono). Most sore throats go away without medical treatment. CAUSES  The most common causes of a sore throat include:  A viral infection, such as a cold, flu, or mono.  A bacterial infection, such as strep throat, tonsillitis, or whooping cough.  Seasonal allergies.  Dryness in the air.  Irritants, such as smoke or pollution.  Gastroesophageal reflux disease (GERD). HOME CARE INSTRUCTIONS   Only take over-the-counter medicines as directed by your caregiver.  Drink enough fluids to keep your urine clear or pale yellow.  Rest as needed.  Try using throat sprays, lozenges, or sucking on hard candy to ease any pain (if older than 4 years or as directed).  Sip warm liquids, such as broth, herbal tea, or warm water with honey to relieve pain temporarily. You may also eat or drink cold or frozen liquids such as frozen ice pops.  Gargle with salt water (mix 1 tsp salt with 8 oz of water).  Do not smoke and avoid secondhand smoke.  Put a cool-mist humidifier in your bedroom at night to moisten the air. You can also turn on a hot shower and sit in the bathroom with the door closed for 5 10 minutes. SEEK IMMEDIATE MEDICAL CARE IF:  You have difficulty breathing.  You are unable to swallow fluids, soft foods, or your saliva.  You have increased swelling in the throat.  Your sore throat does not get better in 7 days.  You have nausea and vomiting.  You have a fever or persistent symptoms for more than 2 3 days.  You have a fever and your symptoms suddenly get worse. MAKE SURE YOU:   Understand  these instructions.  Will watch your condition.  Will get help right away if you are not doing well or get worse. Document Released: 12/25/2004 Document Revised: 11/03/2012 Document Reviewed: 07/25/2012 [employer] Patient Information 2014 [employer].  

## 2013-05-23 NOTE — Progress Notes (Signed)
Complains of sore throat and congestion for two weeks

## 2013-05-23 NOTE — Progress Notes (Signed)
Patient ID: Brenda Krause, female   DOB: [birth year], 28 y.o.   MRN: [id number]  CC: Followup for sore throat  HPI: 27 year old female with history of migraine headaches were presented to our clinic for complaints of sore throat for 2 weeks ongoing. Patient has tried Cepacol lozenges, Claritin, Zyrtec, Allegra all with very little symptomatic relief. Patient has no fevers or chills. No cough. No pain on swallowing.  No Known Allergies History reviewed. No pertinent past medical history. Current Outpatient Prescriptions on File Prior to Visit  Medication Sig Dispense Refill  . Alum & Mag Hydroxide-Simeth (ANTACID LIQUID PO) Take 20 mLs by mouth once.      Marland Kitchen HYDROcodone-acetaminophen (NORCO/VICODIN) 5-325 MG per tablet Take 1 tablet by mouth every 4 (four) hours as needed for pain.  6 tablet  0  . omeprazole (PRILOSEC) 20 MG capsule Take 1 capsule (20 mg total) by mouth daily. Take one capsule twice daily for the first 4 days, then once daily after that.  20 capsule  0   No current facility-administered medications on file prior to visit.   Family medical history significant for HTN, HLD  History   Social History  . Marital Status: Single    Spouse Name: N/A    Number of Children: N/A  . Years of Education: N/A   Occupational History  . Not on file.   Social History Main Topics  . Smoking status: Never Smoker   . Smokeless tobacco: Not on file  . Alcohol Use: No  . Drug Use: No  . Sexually Active: Not on file   Other Topics Concern  . Not on file   Social History Narrative  . No narrative on file    Review of Systems  Constitutional: Negative for fever, chills, diaphoresis, activity change, appetite change and fatigue.  HENT: Negative for ear pain, nosebleeds, congestion, facial swelling, rhinorrhea, neck pain, neck stiffness and ear discharge. Positive for sore throat Eyes: Negative for pain, discharge, redness, itching and visual disturbance.  Respiratory: Negative for cough,  choking, chest tightness, shortness of breath, wheezing and stridor.   Cardiovascular: Negative for chest pain, palpitations and leg swelling.  Gastrointestinal: Negative for abdominal distention.  Genitourinary: Negative for dysuria, urgency, frequency, hematuria, flank pain, decreased urine volume, difficulty urinating and dyspareunia.  Musculoskeletal: Negative for back pain, joint swelling, arthralgias and gait problem.  Neurological: Negative for dizziness, tremors, seizures, syncope, facial asymmetry, speech difficulty, weakness, light-headedness, numbness and headaches.  Hematological: Negative for adenopathy. Does not bruise/bleed easily.  Psychiatric/Behavioral: Negative for hallucinations, behavioral problems, confusion, dysphoric mood, decreased concentration and agitation.    Objective:   Filed Vitals:   05/23/13 1627  BP: 98/64  Pulse: 78  Temp: 98.3 F (36.8 C)  Resp: 16    Physical Exam  Constitutional: Appears well-developed and well-nourished. No distress.  HENT: Normocephalic. External right and left ear normal. Oropharynx is clear and moist.  Eyes: Conjunctivae and EOM are normal. PERRLA, no scleral icterus.  Neck: Normal ROM. Neck supple. No JVD. No tracheal deviation. No thyromegaly.  CVS: RRR, S1/S2 +, no murmurs, no gallops, no carotid bruit.  Pulmonary: Effort and breath sounds normal, no stridor, rhonchi, wheezes, rales.  Abdominal: Soft. BS +,  no distension, tenderness, rebound or guarding.  Musculoskeletal: Normal range of motion. No edema and no tenderness.  Lymphadenopathy: No lymphadenopathy noted, cervical, inguinal. Neuro: Alert. Normal reflexes, muscle tone coordination. No cranial nerve deficit. Skin: Skin is warm and dry. No rash noted. Not diaphoretic. No erythema.  No pallor.  Psychiatric: Normal mood and affect. Behavior, judgment, thought content normal.   Lab Results  Component Value Date   WBC 7.7 03/26/2013   HGB 11.7* 03/26/2013   HCT  33.9* 03/26/2013   MCV 68.9* 03/26/2013   PLT 321 03/26/2013   Lab Results  Component Value Date   CREATININE 0.70 03/26/2013   BUN 18 03/26/2013   NA 141 03/26/2013   K 3.9 03/26/2013   CL 107 03/26/2013   CO2 25 03/26/2013    No results found for this basename: HGBA1C   Lipid Panel     Component Value Date/Time   CHOL 162 03/14/2010 2156   TRIG 103 03/14/2010 2156   HDL 81 03/14/2010 2156   CHOLHDL 2.0 Ratio 03/14/2010 2156   VLDL 21 03/14/2010 2156   LDLCALC 60 03/14/2010 2156       Assessment and plan:   Patient Active Problem List   Diagnosis Date Noted  . ALLERGIC RHINITIS - Patient tried over-the-counter Claritin, Zyrtec, Allegra with no symptomatic relief. She continues to have sore throat. She tried Cepacol lozenges with no relief. I have provided a prescription for Singulair for 7 days to see if this provides any symptomatic relief. If not then perhaps patient can see ear nose and throat doctor for further evaluation. 02/27/2010

## 2013-06-21 ENCOUNTER — Emergency Department (HOSPITAL_COMMUNITY): Payer: Medicaid Other

## 2013-06-21 ENCOUNTER — Emergency Department (HOSPITAL_COMMUNITY)
Admission: EM | Admit: 2013-06-21 | Discharge: 2013-06-21 | Disposition: A | Discharge status: Home / Self Care | Payer: Medicaid Other | Attending: Emergency Medicine | Admitting: Emergency Medicine

## 2013-06-21 ENCOUNTER — Encounter (HOSPITAL_COMMUNITY): Payer: Self-pay

## 2013-06-21 DIAGNOSIS — R079 Chest pain, unspecified: Secondary | ICD-10-CM | POA: Insufficient documentation

## 2013-06-21 DIAGNOSIS — R0602 Shortness of breath: Secondary | ICD-10-CM | POA: Insufficient documentation

## 2013-06-21 DIAGNOSIS — Z3202 Encounter for pregnancy test, result negative: Secondary | ICD-10-CM | POA: Insufficient documentation

## 2013-06-21 DIAGNOSIS — K2970 Gastritis, unspecified, without bleeding: Secondary | ICD-10-CM | POA: Insufficient documentation

## 2013-06-21 DIAGNOSIS — R110 Nausea: Secondary | ICD-10-CM | POA: Insufficient documentation

## 2013-06-21 DIAGNOSIS — R000 Tachycardia, unspecified: Secondary | ICD-10-CM | POA: Insufficient documentation

## 2013-06-21 DIAGNOSIS — Z79899 Other long term (current) drug therapy: Secondary | ICD-10-CM | POA: Insufficient documentation

## 2013-06-21 LAB — POCT I-STAT TROPONIN I: Troponin i, poc: 0 ng/mL (ref 0.00–0.08)

## 2013-06-21 LAB — CBC WITH DIFFERENTIAL/PLATELET
Basophils Absolute: 0 10*3/uL (ref 0.0–0.1)
Eosinophils Absolute: 0.1 10*3/uL (ref 0.0–0.7)
HCT: 34.3 % — ABNORMAL LOW (ref 36.0–46.0)
Lymphocytes Relative: 21 % (ref 12–46)
Lymphs Abs: 2.6 10*3/uL (ref 0.7–4.0)
MCHC: 34.7 g/dL (ref 30.0–36.0)
MCV: 70.4 fL — ABNORMAL LOW (ref 78.0–100.0)
Neutro Abs: 8.2 10*3/uL — ABNORMAL HIGH (ref 1.7–7.7)
Platelets: 290 10*3/uL (ref 150–400)
RDW: 14.7 % (ref 11.5–15.5)

## 2013-06-21 LAB — URINALYSIS, ROUTINE W REFLEX MICROSCOPIC
Nitrite: NEGATIVE
Protein, ur: NEGATIVE mg/dL
Specific Gravity, Urine: 1.008 (ref 1.005–1.030)
Urobilinogen, UA: 0.2 mg/dL (ref 0.0–1.0)

## 2013-06-21 LAB — URINE MICROSCOPIC-ADD ON

## 2013-06-21 LAB — COMPREHENSIVE METABOLIC PANEL
ALT: 74 U/L — ABNORMAL HIGH (ref 0–35)
BUN: 11 mg/dL (ref 6–23)
CO2: 26 mEq/L (ref 19–32)
Calcium: 9.2 mg/dL (ref 8.4–10.5)
Creatinine, Ser: 0.68 mg/dL (ref 0.50–1.10)
GFR calc Af Amer: >90 mL/min (ref >90)
GFR calc non Af Amer: >90 mL/min (ref >90)
Glucose, Bld: 99 mg/dL (ref 70–99)
Sodium: 137 mEq/L (ref 135–145)
Total Protein: 8 g/dL (ref 6.0–8.3)

## 2013-06-21 LAB — LIPASE, BLOOD: Lipase: 27 U/L (ref 11–59)

## 2013-06-21 MED ORDER — MORPHINE SULFATE 4 MG/ML IJ SOLN
4.0000 mg | Freq: Once | INTRAMUSCULAR | Status: AC
  Administered 2013-06-21: 4 mg via INTRAVENOUS
  Filled 2013-06-21: qty 1

## 2013-06-21 MED ORDER — GI COCKTAIL ~~LOC~~
30.0000 mL | Freq: Once | ORAL | Status: AC
  Administered 2013-06-21: 30 mL via ORAL
  Filled 2013-06-21: qty 30

## 2013-06-21 MED ORDER — ONDANSETRON HCL 4 MG/2ML IJ SOLN
4.0000 mg | INTRAMUSCULAR | Status: AC
  Administered 2013-06-21: 4 mg via INTRAVENOUS
  Filled 2013-06-21: qty 2

## 2013-06-21 MED ORDER — SODIUM CHLORIDE 0.9 % IV BOLUS (SEPSIS)
1000.0000 mL | Freq: Once | INTRAVENOUS | Status: AC
  Administered 2013-06-21: 1000 mL via INTRAVENOUS

## 2013-06-21 MED ORDER — FAMOTIDINE IN NACL 20-0.9 MG/50ML-% IV SOLN
20.0000 mg | Freq: Once | INTRAVENOUS | Status: AC
  Administered 2013-06-21: 20 mg via INTRAVENOUS
  Filled 2013-06-21: qty 50

## 2013-06-21 MED ORDER — OMEPRAZOLE 20 MG PO CPDR: 40.0000 mg | DELAYED_RELEASE_CAPSULE | Freq: Every day | ORAL | Status: DC

## 2013-06-21 NOTE — ED Provider Notes (Signed)
History    CSN: [id number] Arrival date & time 06/21/13  4098  First MD Initiated Contact with Patient 06/21/13 1903     Chief Complaint  Patient presents with  . Abdominal Pain   (Consider location/radiation/quality/duration/timing/severity/associated sxs/prior Treatment) HPI Comments: Patient is a 27 year old female with no significant past medical history presents for epigastric pain radiating through to her back. Symptoms have been constant for the last 3 days. She states the pain is aching and sharp in nature, worse when supine and when walking, and without any alleviating factors. She's tried Aleve for the pain without relief. She admits to associated nausea as well as chest pain and shortness of breath. Patient denies fevers, syncope, cough, vomiting, diarrhea, melena or hematochezia, dysuria or hematuria, and numbness or tingling in her extremities. Patient states she was seen for this pain 2 months ago without any significant findings. She denies a history of abdominal surgery and states her last bowel movement was this morning which was normal in color and consistency. Patient on Depo-Provera until 5 months ago.  Patient is a 27 y.o. female presenting with abdominal pain. The history is provided by the patient. No language interpreter was used.  Abdominal Pain Associated symptoms include abdominal pain, chest pain and nausea. Pertinent negatives include no coughing, fever, numbness or weakness.   History reviewed. No pertinent past medical history. History reviewed. No pertinent past surgical history. History reviewed. No pertinent family history. History  Substance Use Topics  . Smoking status: Never Smoker   . Smokeless tobacco: Not on file  . Alcohol Use: No   OB History   Grav Para Term Preterm Abortions TAB SAB Ect Mult Living                 Review of Systems  Constitutional: Negative for fever.  Respiratory: Positive for shortness of breath. Negative for cough and  wheezing.   Cardiovascular: Positive for chest pain.  Gastrointestinal: Positive for nausea and abdominal pain.  Genitourinary: Negative for dysuria and hematuria.  Neurological: Negative for syncope, weakness and numbness.  All other systems reviewed and are negative.    Allergies  Review of patient's allergies indicates no known allergies.  Home Medications   Current Outpatient Rx  Name  Route  Sig  Dispense  Refill  . guaiFENesin (MUCINEX) 600 MG 12 hr tablet   Oral   Take 1,200 mg by mouth once as needed for congestion.         . montelukast (SINGULAIR) 10 MG tablet   Oral   Take 10 mg by mouth at bedtime.         . naproxen sodium (ANAPROX) 220 MG tablet   Oral   Take 440 mg by mouth daily as needed (for pain).         Marland Kitchen omeprazole (PRILOSEC) 20 MG capsule   Oral   Take 2 capsules (40 mg total) by mouth daily.   60 capsule   0    BP 108/70  Pulse 93  Temp(Src) 99.6 F (37.6 C) (Oral)  Resp 14  Ht 5\' 1"  (1.549 m)  Wt 140 lb (63.504 kg)  BMI 26.47 kg/m2  SpO2 100%  Physical Exam  Nursing note and vitals reviewed. Constitutional: She is oriented to person, place, and time. She appears well-developed and well-nourished. No distress.  HENT:  Head: Normocephalic and atraumatic.  Mouth/Throat: Oropharynx is clear and moist. No oropharyngeal exudate.  Eyes: Conjunctivae and EOM are normal. Pupils are equal, round, and  reactive to light. No scleral icterus.  Neck: Normal range of motion.  Cardiovascular: Regular rhythm, normal heart sounds and intact distal pulses.   Tachycardic rate  Pulmonary/Chest: Effort normal and breath sounds normal. No respiratory distress. She has no wheezes. She has no rales.  Abdominal: Soft. She exhibits no distension and no mass. There is tenderness (focal TTP in epigastric region). There is no rebound and no guarding.  No Murphy sign, tenderness at McBurney's point, or peritoneal signs.  Musculoskeletal: Normal range of  motion.  Neurological: She is alert and oriented to person, place, and time.  Skin: Skin is warm and dry. No rash noted. She is not diaphoretic. No erythema. No pallor.  Psychiatric: She has a normal mood and affect. Her behavior is normal.    ED Course  Procedures (including critical care time) Labs Reviewed  CBC WITH DIFFERENTIAL - Abnormal; Notable for the following:    WBC 12.5 (*)    Hemoglobin 11.9 (*)    HCT 34.3 (*)    MCV 70.4 (*)    MCH 24.4 (*)    Monocytes Relative 13 (*)    Neutro Abs 8.2 (*)    Monocytes Absolute 1.6 (*)    All other components within normal limits  COMPREHENSIVE METABOLIC PANEL - Abnormal; Notable for the following:    Potassium 3.4 (*)    AST 49 (*)    ALT 74 (*)    All other components within normal limits  URINALYSIS, ROUTINE W REFLEX MICROSCOPIC - Abnormal; Notable for the following:    Hgb urine dipstick MODERATE (*)    Leukocytes, UA TRACE (*)    All other components within normal limits  URINE MICROSCOPIC-ADD ON - Abnormal; Notable for the following:    Squamous Epithelial / LPF FEW (*)    All other components within normal limits  LIPASE, BLOOD  D-DIMER, QUANTITATIVE  POCT I-STAT TROPONIN I  POCT PREGNANCY, URINE    Date: 06/21/2013  Rate: 110  Rhythm: sinus tachycardia  QRS Axis: normal  Intervals: normal  ST/T Wave abnormalities: normal  Conduction Disutrbances:none  Narrative Interpretation: Sinus tachycardia; no STEMI  Old EKG Reviewed: none available  Dg Chest 2 View  06/21/2013   *RADIOLOGY REPORT*  Clinical Data: Short of breath.  Epigastric pain.  Chills.  CHEST - 2 VIEW  Comparison: None.  Findings: Normal heart size.  Clear lungs.  No pleural effusion. The no pneumothorax.  IMPRESSION: No active cardiopulmonary disease.   Original Report Authenticated By: Jolaine Click, M.D.   US Abdomen Complete  06/21/2013   *RADIOLOGY REPORT*  Clinical Data:  Abdominal pain.  ABDOMEN ULTRASOUND  Technique:  Complete abdominal  ultrasound examination was performed including evaluation of the liver, gallbladder, bile ducts, pancreas, kidneys, spleen, IVC, and abdominal aorta.  Comparison:  None  Findings:  Gallbladder:  No shadowing gallstones or echogenic sludge.  No gallbladder wall thickening or pericholecystic fluid.  Negative sonographic Murphy's sign according to the ultrasound technologist.  Common Bile Duct:  Normal caliber of 4 mm.  Liver:  No focal mass lesion seen.  Within normal limits in parenchymal echogenicity.  IVC:  Patent throughout its visualized course in the abdomen.  Pancreas:  Although the pancreas is difficult to visualize in its entirety, no focal pancreatic abnormality is identified.  Spleen:  The spleen shows normal echotexture and size.  Kidneys:  The left kidney measures approximately 10.6 cm in the right kidney 9.9 cm.  Both have a normal sonographic appearance without evidence of  hydronephrosis or focal lesion.  Abdominal Aorta:  Visualized abdominal aorta is of normal caliber.  IMPRESSION: Unremarkable abdominal ultrasound.   Original Report Authenticated By: Irish Lack, M.D.   1. Gastritis    MDM  27 year old female with no significant past medical history presents for epigastric pain x3 days. Significant for mild leukocytosis. H&H at baseline and kidney function preserved. Patient with mild elevation of both AST and ALT; however, abdominal ultrasound without evidence of hepatitis, cholelithiasis, or cholecystitis. Chest x-ray unremarkable and d-dimer within normal limits, effectively ruling out pulmonary embolism. Urine without evidence of infection and urine pregnancy negative. Patient endorses improvement in symptoms with Pepcid, GI cocktail, Zofran, morphine, and IV fluids. Patient also endorses drinking on occasion. Symptoms most consistent with gastritis or gastric ulcer. Patient afebrile and hemodynamically stable. Appropriate for discharge with gastroenterology followup for further  evaluation of symptoms. Prescription for omeprazole given as well as referral to [facility]. Indications for ED return discussed and patient agreeable to plan. Patient seen also by Dr. Jodi Mourning who is in agreement with this workup, assessment, and patient's stability for discharge.     Antony Madura, PA-C 06/21/13 2227

## 2013-06-21 NOTE — ED Notes (Signed)
Pt c/o epigastric pain radiating through to her back and under bilateral breast, nausea, and tired x2 days. Pt seen here for the same last month. Pt denies SOB, dizziness, cough or congestion

## 2013-06-23 NOTE — ED Provider Notes (Signed)
Medical screening examination/treatment/procedure(s) were conducted as a shared visit with non-physician practitioner(s) or resident  and myself.  I personally evaluated the patient during the encounter and agree with the findings and plan unless otherwise indicated.  Mild RUQ/ epig pain.  [place] no acute findings.  Well appearing.  Fup discussed.    Enid Skeens, MD 06/23/13 [phone]

## 2013-06-25 ENCOUNTER — Emergency Department (HOSPITAL_COMMUNITY)
Admission: EM | Admit: 2013-06-25 | Discharge: 2013-06-25 | Disposition: A | Discharge status: Home / Self Care | Payer: Medicaid Other | Source: Home / Self Care

## 2013-06-25 ENCOUNTER — Encounter (HOSPITAL_COMMUNITY): Payer: Self-pay | Admitting: Emergency Medicine

## 2013-06-25 DIAGNOSIS — J029 Acute pharyngitis, unspecified: Secondary | ICD-10-CM

## 2013-06-25 LAB — POCT RAPID STREP A: Streptococcus, Group A Screen (Direct): NEGATIVE

## 2013-06-25 MED ORDER — AMOXICILLIN-POT CLAVULANATE 875-125 MG PO TABS: 1.0000 | ORAL_TABLET | Freq: Two times a day (BID) | ORAL | Status: DC

## 2013-06-25 NOTE — ED Notes (Signed)
Pt c/o sore throat onset 1 week... Reports odynophagia, fevers... Seen at the [facility] and given Singulair... She's alert w/no signs of acute distress.

## 2013-06-25 NOTE — ED Provider Notes (Signed)
Brenda Krause is a 27 y.o. female who presents to Urgent Care today for sore throat. Patient is a sore throat off and on for several months and has worsened recently. She was seen in June for the same and rapid strep test and was negative the patient was treated with Singulair which did not help. She notes the pain is worsened recently. She notes pain in the right side of her throat especially with swallowing. She notes a mild fever but otherwise feels well and no trouble breathing. Additionally she has been worked up for abdominal pain and thought to have gastritis and stomach ulcers. She is currently on omeprazole and has an appointment for an EGD in about one week.   Of note she has been seen by ear nose and throat doctor for this. She notes that they found no abnormalities.   PMH reviewed. As above History  Substance Use Topics  . Smoking status: Never Smoker   . Smokeless tobacco: Not on file  . Alcohol Use: No   ROS as above Medications reviewed. No current facility-administered medications for this encounter.   Current Outpatient Prescriptions  Medication Sig Dispense Refill  . montelukast (SINGULAIR) 10 MG tablet Take 10 mg by mouth at bedtime.      . naproxen sodium (ANAPROX) 220 MG tablet Take 440 mg by mouth daily as needed (for pain).      Marland Kitchen omeprazole (PRILOSEC) 20 MG capsule Take 2 capsules (40 mg total) by mouth daily.  60 capsule  0  . amoxicillin-clavulanate (AUGMENTIN) 875-125 MG per tablet Take 1 tablet by mouth every 12 (twelve) hours.  14 tablet  0  . guaiFENesin (MUCINEX) 600 MG 12 hr tablet Take 1,200 mg by mouth once as needed for congestion.        Exam:  BP 106/69  Pulse 96  Temp(Src) 100.3 F (37.9 C) (Oral)  Resp 20  SpO2 95% Gen: Well NAD HEENT: EOMI,  MMM, bilateral tonsillar hypertrophy with exudate. No significant anterior cervical lymphadenopathy.  Lungs: CTABL Nl WOB Heart: RRR no MRG Abd: NABS, NT, ND Exts: Non edematous BL  LE, warm and well  perfused.   Results for orders placed during the hospital encounter of 06/25/13 (from the past 24 hour(s))  POCT RAPID STREP A ([facility] ONLY)     Status: None   Collection Time    06/25/13 11:18 AM      Result Value Range   Streptococcus, Group A Screen (Direct) NEGATIVE  NEGATIVE   No results found.  Assessment and Plan: 27 y.o. female with pharyngitis with tonsillar hypertrophy and exudate with negative rapid strep test. Awaiting throat culture. Additionally patient is awaiting an EGD.  Plan to treat empirically with Augmentin while awaiting throat culture. Refer back to ear nose and throat for further intervention is needed assuming EGD does not show cause of her pain.  Discussed warning signs or symptoms. Please see discharge instructions. Patient expresses understanding.      Rodolph Bong, MD 06/25/13 1154

## 2013-06-27 LAB — CULTURE, GROUP A STREP

## 2013-06-29 ENCOUNTER — Encounter: Payer: Self-pay | Admitting: Family Medicine

## 2013-06-29 ENCOUNTER — Ambulatory Visit: Payer: Medicaid Other | Attending: Family Medicine | Admitting: Family Medicine

## 2013-06-29 VITALS — BP 100/66 | HR 69 | Temp 98.4°F | Resp 18 | Wt 138.6 lb

## 2013-06-29 DIAGNOSIS — K2990 Gastroduodenitis, unspecified, without bleeding: Secondary | ICD-10-CM

## 2013-06-29 DIAGNOSIS — K2970 Gastritis, unspecified, without bleeding: Secondary | ICD-10-CM | POA: Insufficient documentation

## 2013-06-29 DIAGNOSIS — K219 Gastro-esophageal reflux disease without esophagitis: Secondary | ICD-10-CM

## 2013-06-29 DIAGNOSIS — J309 Allergic rhinitis, unspecified: Secondary | ICD-10-CM

## 2013-06-29 NOTE — Progress Notes (Signed)
Patient here for hospital follow up DX- gastritis

## 2013-06-29 NOTE — Progress Notes (Signed)
Patient ID: Brenda Krause, female   DOB: May 05, 1986, 27 y.o.   MRN: [id number]  CC:  Follow up  Chief Complaint  Patient presents with  . Hospitalization Follow-up   HPI: Pt says that she was told to come here to get her referral for GI.  She was seen in ER and suspected of having significant gastritis and esophagitis.  She also has chronic nasal drainage and sore throat and swollen glands. She says that she has been evaluated by ENT and there was nothing that they could do for her. She has a lot of allergy symptoms as well.  She was prescribed prilosec.   No Known Allergies History reviewed. No pertinent past medical history. Current Outpatient Prescriptions on File Prior to Visit  Medication Sig Dispense Refill  . guaiFENesin (MUCINEX) 600 MG 12 hr tablet Take 1,200 mg by mouth once as needed for congestion.      . montelukast (SINGULAIR) 10 MG tablet Take 10 mg by mouth at bedtime.      . naproxen sodium (ANAPROX) 220 MG tablet Take 440 mg by mouth daily as needed (for pain).      Marland Kitchen omeprazole (PRILOSEC) 20 MG capsule Take 2 capsules (40 mg total) by mouth daily.  60 capsule  0   No current facility-administered medications on file prior to visit.   History reviewed. No pertinent family history. History   Social History  . Marital Status: Single    Spouse Name: N/A    Number of Children: N/A  . Years of Education: N/A   Occupational History  . Not on file.   Social History Main Topics  . Smoking status: Never Smoker   . Smokeless tobacco: Not on file  . Alcohol Use: No  . Drug Use: No  . Sexually Active: Not on file   Other Topics Concern  . Not on file   Social History Narrative  . No narrative on file    Review of Systems  Constitutional: Negative for fever, chills, diaphoresis, activity change, appetite change and fatigue.  HENT: Negative for ear pain, nosebleeds, congestion, facial swelling, rhinorrhea, neck pain, neck stiffness and ear discharge.   Eyes: Negative  for pain, discharge, redness, itching and visual disturbance.  Respiratory: Negative for cough, choking, chest tightness, shortness of breath, wheezing and stridor.   Cardiovascular: Negative for chest pain, palpitations and leg swelling.  Gastrointestinal: Negative for abdominal distention.  Genitourinary: Negative for dysuria, urgency, frequency, hematuria, flank pain, decreased urine volume, difficulty urinating and dyspareunia.  Musculoskeletal: Negative for back pain, joint swelling, arthralgias and gait problem.  Neurological: Negative for dizziness, tremors, seizures, syncope, facial asymmetry, speech difficulty, weakness, light-headedness, numbness and headaches.  Hematological: Negative for adenopathy. Does not bruise/bleed easily.  Psychiatric/Behavioral: Negative for hallucinations, behavioral problems, confusion, dysphoric mood, decreased concentration and agitation.    Objective:   Filed Vitals:   06/29/13 1337  BP: 100/66  Pulse: 69  Temp: 98.4 F (36.9 C)  Resp: 18    Physical Exam  Constitutional: Appears well-developed and well-nourished. No distress.  HENT: Normocephalic. External right and left ear normal. Oropharynx is clear and moist.  swollen red nasal turbinates bilateral.  Eyes: Conjunctivae and EOM are normal. PERRLA, no scleral icterus.  Neck: Normal ROM. Neck supple. No JVD. No tracheal deviation. No thyromegaly.  CVS: RRR, S1/S2 +, no murmurs, no gallops, no carotid bruit.  Pulmonary: Effort and breath sounds normal, no stridor, rhonchi, wheezes, rales.  Abdominal: Soft. BS +,  no distension, tenderness,  rebound or guarding.  Musculoskeletal: Normal range of motion. No edema and no tenderness.  Lymphadenopathy: No lymphadenopathy noted, cervical, inguinal. Neuro: Alert. Normal reflexes, muscle tone coordination. No cranial nerve deficit. Skin: Skin is warm and dry. No rash noted. Not diaphoretic. No erythema. No pallor.  Psychiatric: Normal mood and  affect. Behavior, judgment, thought content normal.   Lab Results  Component Value Date   WBC 12.5* 06/21/2013   HGB 11.9* 06/21/2013   HCT 34.3* 06/21/2013   MCV 70.4* 06/21/2013   PLT 290 06/21/2013   Lab Results  Component Value Date   CREATININE 0.68 06/21/2013   BUN 11 06/21/2013   NA 137 06/21/2013   K 3.4* 06/21/2013   CL 103 06/21/2013   CO2 26 06/21/2013    No results found for this basename: HGBA1C   Lipid Panel     Component Value Date/Time   CHOL 162 03/14/2010 2156   TRIG 103 03/14/2010 2156   HDL 81 03/14/2010 2156   CHOLHDL 2.0 Ratio 03/14/2010 2156   VLDL 21 03/14/2010 2156   LDLCALC 60 03/14/2010 2156       Assessment and plan:   Patient Active Problem List   Diagnosis Date Noted  . Gastritis 06/29/2013  . GERD (gastroesophageal reflux disease) 06/29/2013  . HEADACHE 10/29/2010  . ALLERGIC RHINITIS 02/27/2010     Gastritis - Plan: Ambulatory referral to Gastroenterology, Ambulatory referral to Allergy  GERD (gastroesophageal reflux disease) - Plan: Ambulatory referral to Gastroenterology, Ambulatory referral to Allergy  Allergic rhinitis - Plan: Ambulatory referral to Allergy  RTC in 3 months  The patient was given clear instructions to go to ER or return to medical center if symptoms don't improve, worsen or new problems develop.  The patient verbalized understanding.  The patient was told to call to get any lab results if not heard anything in the next week.    Rodney Langton, MD, CDE, FAAFP [facility] [place], [place]

## 2013-06-29 NOTE — Patient Instructions (Signed)
Gastritis, Adult Gastritis is soreness and swelling (inflammation) of the lining of the stomach. Gastritis can develop as a sudden onset (acute) or long-term (chronic) condition. If gastritis is not treated, it can lead to stomach bleeding and ulcers. CAUSES  Gastritis occurs when the stomach lining is weak or damaged. Digestive juices from the stomach then inflame the weakened stomach lining. The stomach lining may be weak or damaged due to viral or bacterial infections. One common bacterial infection is the Helicobacter pylori infection. Gastritis can also result from excessive alcohol consumption, taking certain medicines, or having too much acid in the stomach.  SYMPTOMS  In some cases, there are no symptoms. When symptoms are present, they may include:  Pain or a burning sensation in the upper abdomen.  Nausea.  Vomiting.  An uncomfortable feeling of fullness after eating. DIAGNOSIS  Your caregiver may suspect you have gastritis based on your symptoms and a physical exam. To determine the cause of your gastritis, your caregiver may perform the following:  Blood or stool tests to check for the H pylori bacterium.  Gastroscopy. A thin, flexible tube (endoscope) is passed down the esophagus and into the stomach. The endoscope has a light and camera on the end. Your caregiver uses the endoscope to view the inside of the stomach.  Taking a tissue sample (biopsy) from the stomach to examine under a microscope. TREATMENT  Depending on the cause of your gastritis, medicines may be prescribed. If you have a bacterial infection, such as an H pylori infection, antibiotics may be given. If your gastritis is caused by too much acid in the stomach, H2 blockers or antacids may be given. Your caregiver may recommend that you stop taking aspirin, ibuprofen, or other nonsteroidal anti-inflammatory drugs (NSAIDs). HOME CARE INSTRUCTIONS  Only take over-the-counter or prescription medicines as directed by  your caregiver.  If you were given antibiotic medicines, take them as directed. Finish them even if you start to feel better.  Drink enough fluids to keep your urine clear or pale yellow.  Avoid foods and drinks that make your symptoms worse, such as:  Caffeine or alcoholic drinks.  Chocolate.  Peppermint or mint flavorings.  Garlic and onions.  Spicy foods.  Citrus fruits, such as oranges, lemons, or limes.  Tomato-based foods such as sauce, chili, salsa, and pizza.  Fried and fatty foods.  Eat small, frequent meals instead of large meals. SEEK IMMEDIATE MEDICAL CARE IF:   You have black or dark red stools.  You vomit blood or material that looks like coffee grounds.  You are unable to keep fluids down.  Your abdominal pain gets worse.  You have a fever.  You do not feel better after 1 week.  You have any other questions or concerns. MAKE SURE YOU:  Understand these instructions.  Will watch your condition.  Will get help right away if you are not doing well or get worse. Document Released: 11/11/2001 Document Revised: 05/18/2012 Document Reviewed: 12/31/2011 [facility] Patient Information 2014 [place], [place]. Esophagitis Esophagitis is inflammation of the esophagus. It can involve swelling, soreness, and pain in the esophagus. This condition can make it difficult and painful to swallow. CAUSES  Most causes of esophagitis are not serious. Many different factors can cause esophagitis, including:  Gastroesophageal reflux disease (GERD). This is when acid from your stomach flows up into the esophagus.  Recurrent vomiting.  An allergic-type reaction.  Certain medicines, especially those that come in large pills.  Ingestion of harmful chemicals, such as  household cleaning products.  Heavy alcohol use.  An infection of the esophagus.  Radiation treatment for cancer.  Certain diseases such as sarcoidosis, Crohn's disease, and scleroderma. These diseases  may cause recurrent esophagitis. SYMPTOMS   Trouble swallowing.  Painful swallowing.  Chest pain.  Difficulty breathing.  Nausea.  Vomiting.  Abdominal pain. DIAGNOSIS  Your caregiver will take your history and do a physical exam. Depending upon what your caregiver finds, certain tests may also be done, including:  Barium X-ray. You will drink a solution that coats the esophagus, and X-rays will be taken.  Endoscopy. A lighted tube is put down the esophagus so your caregiver can examine the area.  Allergy tests. These can sometimes be arranged through follow-up visits. TREATMENT  Treatment will depend on the cause of your esophagitis. In some cases, steroids or other medicines may be given to help relieve your symptoms or to treat the underlying cause of your condition. Medicines that may be recommended include:  Viscous lidocaine, to soothe the esophagus.  Antacids.  Acid reducers.  Proton pump inhibitors.  Antiviral medicines for certain viral infections of the esophagus.  Antifungal medicines for certain fungal infections of the esophagus.  Antibiotic medicines, depending on the cause of the esophagitis. HOME CARE INSTRUCTIONS   Avoid foods and drinks that seem to make your symptoms worse.  Eat small, frequent meals instead of large meals.  Avoid eating for the 3 hours prior to your bedtime.  If you have trouble taking pills, use a pill splitter to decrease the size and likelihood of the pill getting stuck or injuring the esophagus on the way down. Drinking water after taking a pill also helps.  Stop smoking if you smoke.  Maintain a healthy weight.  Wear loose-fitting clothing. Do not wear anything tight around your waist that causes pressure on your stomach.  Raise the head of your bed 6 to 8 inches with wood blocks to help you sleep. Extra pillows will not help.  Only take over-the-counter or prescription medicines as directed by your caregiver. SEEK  IMMEDIATE MEDICAL CARE IF:  You have severe chest pain that radiates into your arm, neck, or jaw.  You feel sweaty, dizzy, or lightheaded.  You have shortness of breath.  You vomit blood.  You have difficulty or pain with swallowing.  You have bloody or black, tarry stools.  You have a fever.  You have a burning sensation in the chest more than 3 times a week for more than 2 weeks.  You cannot swallow, drink, or eat.  You drool because you cannot swallow your saliva. MAKE SURE YOU:  Understand these instructions.  Will watch your condition.  Will get help right away if you are not doing well or get worse. Document Released: 12/25/2004 Document Revised: 02/09/2012 Document Reviewed: 07/18/2011 [facility] Patient Information 2014 [place], [place]. Heartburn Heartburn is a painful, burning sensation in the chest. It may feel worse in certain positions, such as lying down or bending over. It is caused by stomach acid backing up into the tube that carries food from the mouth down to the stomach (lower esophagus).  CAUSES   Large meals.  Certain foods and drinks.  Exercise.  Increased acid production.  Being overweight or obese.  Certain medicines. SYMPTOMS   Burning pain in the chest or lower throat.  Bitter taste in the mouth.  Coughing. DIAGNOSIS  If the usual treatments for heartburn do not improve your symptoms, then tests may be done to see if  there is another condition present. Possible tests may include:  X-rays.  Endoscopy. This is when a tube with a light and a camera on the end is used to examine the esophagus and the stomach.  A test to measure the amount of acid in the esophagus (pH test).  A test to see if the esophagus is working properly (esophageal manometry).  Blood, breath, or stool tests to check for bacteria that cause ulcers. TREATMENT   Your caregiver may tell you to use certain over-the-counter medicines (antacids, acid reducers) for  mild heartburn.  Your caregiver may prescribe medicines to decrease the acid in your stomach or protect your stomach lining.  Your caregiver may recommend certain diet changes.  For severe cases, your caregiver may recommend that the head of your bed be elevated on blocks. (Sleeping with more pillows is not an effective treatment as it only changes the position of your head and does not improve the main problem of stomach acid refluxing into the esophagus.) HOME CARE INSTRUCTIONS   Take all medicines as directed by your caregiver.  Raise the head of your bed by putting blocks under the legs if instructed to by your caregiver.  Do not exercise right after eating.  Avoid eating 2 or 3 hours before bed. Do not lie down right after eating.  Eat small meals throughout the day instead of 3 large meals.  Stop smoking if you smoke.  Maintain a healthy weight.  Identify foods and beverages that make your symptoms worse and avoid them. Foods you may want to avoid include:  Peppers.  Chocolate.  High-fat foods, including fried foods.  Spicy foods.  Garlic and onions.  Citrus fruits, including oranges, grapefruit, lemons, and limes.  Food containing tomatoes or tomato products.  Mint.  Carbonated drinks, caffeinated drinks, and alcohol.  Vinegar. SEEK IMMEDIATE MEDICAL CARE IF:  You have severe chest pain that goes down your arm or into your jaw or neck.  You feel sweaty, dizzy, or lightheaded.  You are short of breath.  You vomit blood.  You have difficulty or pain with swallowing.  You have bloody or black, tarry stools.  You have episodes of heartburn more than 3 times a week for more than 2 weeks. MAKE SURE YOU:  Understand these instructions.  Will watch your condition.  Will get help right away if you are not doing well or get worse. Document Released: 04/05/2009 Document Revised: 02/09/2012 Document Reviewed: 05/04/2011 [facility] Patient Information 2014  [place], [place]. Allergic Rhinitis Allergic rhinitis is when the mucous membranes in the nose respond to allergens. Allergens are particles in the air that cause your body to have an allergic reaction. This causes you to release allergic antibodies. Through a chain of events, these eventually cause you to release histamine into the blood stream (hence the use of antihistamines). Although meant to be protective to the body, it is this release that causes your discomfort, such as frequent sneezing, congestion and an itchy runny nose.  CAUSES  The pollen allergens may come from grasses, trees, and weeds. This is seasonal allergic rhinitis, or "hay fever." Other allergens cause year-round allergic rhinitis (perennial allergic rhinitis) such as house dust mite allergen, pet dander and mold spores.  SYMPTOMS   Nasal stuffiness (congestion).  Runny, itchy nose with sneezing and tearing of the eyes.  There is often an itching of the mouth, eyes and ears. It cannot be cured, but it can be controlled with medications. DIAGNOSIS  If you are  unable to determine the offending allergen, skin or blood testing may find it. TREATMENT   Avoid the allergen.  Medications and allergy shots (immunotherapy) can help.  Hay fever may often be treated with antihistamines in pill or nasal spray forms. Antihistamines block the effects of histamine. There are over-the-counter medicines that may help with nasal congestion and swelling around the eyes. Check with your caregiver before taking or giving this medicine. If the treatment above does not work, there are many new medications your caregiver can prescribe. Stronger medications may be used if initial measures are ineffective. Desensitizing injections can be used if medications and avoidance fails. Desensitization is when a patient is given ongoing shots until the body becomes less sensitive to the allergen. Make sure you follow up with your caregiver if problems  continue. SEEK MEDICAL CARE IF:   You develop fever (more than 100.5 F (38.1 C).  You develop a cough that does not stop easily (persistent).  You have shortness of breath.  You start wheezing.  Symptoms interfere with normal daily activities. Document Released: 08/12/2001 Document Revised: 02/09/2012 Document Reviewed: 02/21/2009 [facility] Patient Information 2014 [place], [place].

## 2013-06-30 ENCOUNTER — Encounter: Payer: Self-pay | Admitting: Family Medicine

## 2013-07-04 ENCOUNTER — Encounter: Payer: Self-pay | Admitting: Gastroenterology

## 2013-07-15 ENCOUNTER — Encounter: Payer: Self-pay | Admitting: *Deleted

## 2013-07-21 ENCOUNTER — Ambulatory Visit: Payer: Medicaid Other | Admitting: Gastroenterology

## 2013-07-29 ENCOUNTER — Encounter: Payer: Self-pay | Admitting: *Deleted

## 2013-08-05 ENCOUNTER — Ambulatory Visit: Payer: Medicaid Other | Admitting: Gastroenterology

## 2013-08-17 ENCOUNTER — Ambulatory Visit: Payer: Medicaid Other | Attending: Family Medicine

## 2013-09-06 ENCOUNTER — Encounter: Payer: Self-pay | Admitting: Gastroenterology

## 2013-09-06 ENCOUNTER — Ambulatory Visit (INDEPENDENT_AMBULATORY_CARE_PROVIDER_SITE_OTHER): Payer: Medicaid Other | Admitting: Gastroenterology

## 2013-09-06 ENCOUNTER — Other Ambulatory Visit (INDEPENDENT_AMBULATORY_CARE_PROVIDER_SITE_OTHER): Payer: Medicaid Other

## 2013-09-06 VITALS — BP 90/60 | HR 72 | Ht 61.0 in | Wt 138.1 lb

## 2013-09-06 DIAGNOSIS — R7989 Other specified abnormal findings of blood chemistry: Secondary | ICD-10-CM

## 2013-09-06 DIAGNOSIS — R079 Chest pain, unspecified: Secondary | ICD-10-CM

## 2013-09-06 DIAGNOSIS — K219 Gastro-esophageal reflux disease without esophagitis: Secondary | ICD-10-CM

## 2013-09-06 DIAGNOSIS — D509 Iron deficiency anemia, unspecified: Secondary | ICD-10-CM

## 2013-09-06 LAB — HEPATIC FUNCTION PANEL
AST: 18 U/L (ref 0–37)
Albumin: 4.2 g/dL (ref 3.5–5.2)
Alkaline Phosphatase: 50 U/L (ref 39–117)
Total Protein: 8 g/dL (ref 6.0–8.3)

## 2013-09-06 MED ORDER — PANTOPRAZOLE SODIUM 40 MG PO TBEC: 40.0000 mg | DELAYED_RELEASE_TABLET | Freq: Every day | ORAL | Status: DC

## 2013-09-06 NOTE — Progress Notes (Signed)
History of Present Illness:  This is a somewhat confusing case of a 27 year old Asian female who has had recurrent subxiphoid pain radiating to her back on and off for the last several years, worse and recurrent over the last several months with negative cardiac workup, normal chest x-ray, normal recent upper abdominal ultrasound exam.  Her chest pain is a sharp pain in the subxiphoid area radiating to her back with some associated dysphagia.  She has multiple allergies and is been seen by ENT with negative throat culture and has empirically been treated with Augmentin.  She denies true reflux symptoms with regurgitation or burning substernal pain.  Because of her pain she's been seen in the emergency room on several occasions and did have slightly elevated transaminases although on occasion, values less than 100,lipase normal.  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  In any case, she was on Prilosec 40 mg twice a day in addition to her medications she takes for topical allergies including Mucinex and Singular.  She's been off this medication for several weeks.  She currently denies abuse of alcohol, cigarettes, or NSAIDs.  Patient denies a specific food intolerances, does have mild chronic functional constipation, but denies melena or hematochezia.  She has not had previous endoscopy or barium studies.  She apparently has a lot of atopic upper respiratory type symptoms.  She denies pregnancy and had a last menstrual period 2 weeks ago.  Family history is noncontributory.   I have reviewed this patient's present history, medical and surgical past history, allergies and medications.     ROS:   All systems were reviewed and are negative unless otherwise stated in the HPI.    Physical Exam: Blood pressure 90/60, pulse 72 and regular and weight 138 with a BMI of 26.11.  Examination of the oral pharyngeal area is unremarkable General well developed well  nourished patient in no acute distress, appearing their stated age Eyes PERRLA, no icterus, fundoscopic exam per opthamologist Skin no lesions noted Neck supple, no adenopathy, no thyroid enlargement, no tenderness Chest clear to percussion and auscultation Heart no significant murmurs, gallops or rubs noted Abdomen no hepatosplenomegaly masses or tenderness, BS normal.  Extremities no acute joint lesions, edema, phlebitis or evidence of cellulitis. Neurologic patient oriented x 3, cranial nerves intact, no focal neurologic deficits noted. Psychological mental status normal and normal affect.  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  She has extra esophageal manifestations of GERD with throat pain and hoarseness.  On close questioning, she does not have true dysphagia.  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 I suspect a previous mild transaminase elevation was related to frequent NSAID use.  She has not been on PPI therapy for several weeks for reasons which are unclear.  I think this has to do with her denial process.  In any case, she needs endoscopic exam with esophageal biopsies and examination for H. pylori.  I have restarted her on Dexilant 60 mg a day, shown her a patient education video on acid reflux and is management, and repeat her liver profile.  If her workup is negative will proceed with CCK-HIDA scan.  Labs show a chronic mild iron deficiency anemia probably related to her menstruation with hemoglobin 10.9 and MCV of 70.

## 2013-09-06 NOTE — Patient Instructions (Addendum)
You have been scheduled for an endoscopy with propofol. Please follow written instructions given to you at your visit today. If you use inhalers (even only as needed), please bring them with you on the day of your procedure. Your physician has requested that you go to www.startemmi.com and enter the access code given to you at your visit today. This web site gives a general overview about your procedure. However, you should still follow specific instructions given to you by our office regarding your preparation for the procedure.  You watched a movie today on acid reflux and information is below.  We have sent the following medications to your pharmacy for you to pick up at your convenience: Pantoprazole 40 mg, please take one tablet by mouth once daily   Your physician has requested that you go to the basement for the following lab work before leaving today: Liver Function Test ________________________________________________________________________________________________  Diet for Gastroesophageal Reflux Disease, Adult Reflux (acid reflux) is when acid from your stomach flows up into the esophagus. When acid comes in contact with the esophagus, the acid causes irritation and soreness (inflammation) in the esophagus. When reflux happens often or so severely that it causes damage to the esophagus, it is called gastroesophageal reflux disease (GERD). Nutrition therapy can help ease the discomfort of GERD. FOODS OR DRINKS TO AVOID OR LIMIT  Smoking or chewing tobacco. Nicotine is one of the most potent stimulants to acid production in the gastrointestinal tract.  Caffeinated and decaffeinated coffee and black tea.  Regular or low-calorie carbonated beverages or energy drinks (caffeine-free carbonated beverages are allowed).   Strong spices, such as black pepper, white pepper, red pepper, cayenne, curry powder, and chili powder.  Peppermint or spearmint.  Chocolate.  High-fat foods,  including meats and fried foods. Extra added fats including oils, butter, salad dressings, and nuts. Limit these to less than 8 tsp per day.  Fruits and vegetables if they are not tolerated, such as citrus fruits or tomatoes.  Alcohol.  Any food that seems to aggravate your condition. If you have questions regarding your diet, call your caregiver or a registered dietitian. OTHER THINGS THAT MAY HELP GERD INCLUDE:   Eating your meals slowly, in a relaxed setting.  Eating 5 to 6 small meals per day instead of 3 large meals.  Eliminating food for a period of time if it causes distress.  Not lying down until 3 hours after eating a meal.  Keeping the head of your bed raised 6 to 9 inches (15 to 23 cm) by using a foam wedge or blocks under the legs of the bed. Lying flat may make symptoms worse.  Being physically active. Weight loss may be helpful in reducing reflux in overweight or obese adults.  Wear loose fitting clothing EXAMPLE MEAL PLAN This meal plan is approximately 2,000 calories based on https://www.[facility].org/ meal planning guidelines. Breakfast   cup cooked oatmeal.  1 cup strawberries.  1 cup low-fat milk.  1 oz almonds. Snack  1 cup cucumber slices.  6 oz yogurt (made from low-fat or fat-free milk). Lunch  2 slice whole-wheat bread.  2 oz sliced [place].  2 tsp mayonnaise.  1 cup blueberries.  1 cup snap peas. Snack  6 whole-wheat crackers.  1 oz string cheese. Dinner   cup brown rice.  1 cup mixed veggies.  1 tsp olive oil.  3 oz grilled fish. Document Released: 11/17/2005 Document Revised: 02/09/2012 Document Reviewed: 10/03/2011 [facility] Patient Information 2014 [place], [place]. __________________________________________________________________________________________________________________________________________________________  We are excited to introduce MyChart, a new best-in-class service  that provides you online access to important information in your electronic medical record. We want to make it easier for you to view your health information - all in one secure location - when and where you need it. We expect MyChart will enhance the quality of care and service we provide.  When you register for MyChart, you can:    View your test results.    Request appointments and receive appointment reminders via email.    Request medication renewals.    View your medical history, allergies, medications and immunizations.    Communicate with your physician's office through a password-protected site.    Conveniently print information such as your medication lists.  To find out if MyChart is right for you, please talk to a member of our clinical staff today. We will gladly answer your questions about this free health and wellness tool.  If you are age 29 or older and want a member of your family to have access to your record, you must provide written consent by completing a proxy form available at our office. Please speak to our clinical staff about guidelines regarding accounts for patients younger than age 21.  As you activate your MyChart account and need any technical assistance, please call the MyChart technical support line at [phone]) or email your question to [email or web address]. If you email your question(s), please include your name, a return phone number and the best time to reach you.  If you have non-urgent health-related questions, you can send a message to our office through MyChart at [email or web address]. If you have a medical emergency, call 911.  Thank you for using MyChart as your new health and wellness resource!   MyChart licensed from [employer],  [id number]. Patents Pending.

## 2013-09-12 ENCOUNTER — Encounter: Payer: Self-pay | Admitting: Gastroenterology

## 2013-09-12 ENCOUNTER — Ambulatory Visit (AMBULATORY_SURGERY_CENTER): Payer: Medicaid Other | Admitting: Gastroenterology

## 2013-09-12 ENCOUNTER — Ambulatory Visit: Payer: Medicaid Other | Admitting: Internal Medicine

## 2013-09-12 VITALS — BP 95/63 | HR 71 | Temp 98.9°F | Resp 17 | Ht 61.0 in | Wt 138.0 lb

## 2013-09-12 DIAGNOSIS — D130 Benign neoplasm of esophagus: Secondary | ICD-10-CM

## 2013-09-12 DIAGNOSIS — K219 Gastro-esophageal reflux disease without esophagitis: Secondary | ICD-10-CM

## 2013-09-12 DIAGNOSIS — R079 Chest pain, unspecified: Secondary | ICD-10-CM

## 2013-09-12 MED ORDER — SODIUM CHLORIDE 0.9 % IV SOLN: 500.0000 mL | INTRAVENOUS | Status: DC

## 2013-09-12 NOTE — Patient Instructions (Signed)
YOU HAD AN ENDOSCOPIC PROCEDURE TODAY AT THE [facility]: Refer to the procedure report that was given to you for any specific questions about what was found during the examination.  If the procedure report does not answer your questions, please call your gastroenterologist to clarify.  If you requested that your care partner not be given the details of your procedure findings, then the procedure report has been included in a sealed envelope for you to review at your convenience later.  YOU SHOULD EXPECT: Some feelings of bloating in the abdomen. Passage of more gas than usual.  Walking can help get rid of the air that was put into your GI tract during the procedure and reduce the bloating. If you had a lower endoscopy (such as a colonoscopy or flexible sigmoidoscopy) you may notice spotting of blood in your stool or on the toilet paper. If you underwent a bowel prep for your procedure, then you may not have a normal bowel movement for a few days.  DIET: Your first meal following the procedure should be a light meal and then it is ok to progress to your normal diet.  A half-sandwich or bowl of soup is an example of a good first meal.  Heavy or fried foods are harder to digest and may make you feel nauseous or bloated.  Likewise meals heavy in dairy and vegetables can cause extra gas to form and this can also increase the bloating.  Drink plenty of fluids but you should avoid alcoholic beverages for 24 hours.  ACTIVITY: Your care partner should take you home directly after the procedure.  You should plan to take it easy, moving slowly for the rest of the day.  You can resume normal activity the day after the procedure however you should NOT DRIVE or use heavy machinery for 24 hours (because of the sedation medicines used during the test).    SYMPTOMS TO REPORT IMMEDIATELY: A gastroenterologist can be reached at any hour.  During normal business hours, 8:30 AM to 5:00 PM Monday through Friday,  call [phone].  After hours and on weekends, please call the GI answering service at [phone] who will take a message and have the physician on call contact you.    Following upper endoscopy (EGD)  Vomiting of blood or coffee ground material  New chest pain or pain under the shoulder blades  Painful or persistently difficult swallowing  New shortness of breath  Fever of 100F or higher  Black, tarry-looking stools  FOLLOW UP: If any biopsies were taken you will be contacted by phone or by letter within the next 1-3 weeks.  Call your gastroenterologist if you have not heard about the biopsies in 3 weeks.  Our staff will call the home number listed on your records the next business day following your procedure to check on you and address any questions or concerns that you may have at that time regarding the information given to you following your procedure. This is a courtesy call and so if there is no answer at the home number and we have not heard from you through the emergency physician on call, we will assume that you have returned to your regular daily activities without incident.  SIGNATURES/CONFIDENTIALITY: You and/or your care partner have signed paperwork which will be entered into your electronic medical record.  These signatures attest to the fact that that the information above on your After Visit Summary has been reviewed and is understood.  Full   responsibility of the confidentiality of this discharge information lies with you and/or your care-partner.   Resume medications. Information given on GERD with discharge instructions. 

## 2013-09-12 NOTE — Progress Notes (Signed)
Patient did not experience any of the following events: a burn prior to discharge; a fall within the facility; wrong site/side/patient/procedure/implant event; or a hospital transfer or hospital admission upon discharge from the facility. (G8907) Patient did not have preoperative order for IV antibiotic SSI prophylaxis. (G8918)  

## 2013-09-12 NOTE — Op Note (Signed)
[facility] [address] [place], [address]   ENDOSCOPY PROCEDURE REPORT  PATIENT: Brenda Krause, Brenda Krause  MR#: [id number] BIRTHDATE: [birth year] , 27  yrs. old GENDER: Female ENDOSCOPIST:Cressida Milford Hale Bogus, MD, [facility] REFERRED BY: PROCEDURE DATE:  09/12/2013 PROCEDURE:   EGD w/ biopsy for H.pylori and EGD w/ hot bx forceps/cautery ASA CLASS:    Class II INDICATIONS: Chest pain. MEDICATION: propofol (Diprivan) 150mg  IV TOPICAL ANESTHETIC:  DESCRIPTION OF PROCEDURE:   After the risks and benefits of the procedure were explained, informed consent was obtained.  The LB [id number]  endoscope was introduced through the mouth  and advanced to the second portion of the duodenum .  The instrument was slowly withdrawn as the mucosa was fully examined.      DUODENUM: The duodenal mucosa showed no abnormalities in the bulb and second portion of the duodenum.  STOMACH: The mucosa of the stomach appeared normal.  CLO Bx. done.  ESOPHAGUS: The mucosa of the esophagus appeared normal.Esophageal biopsies done,.    Retroflexed views revealed a 3 cm. hiatal. hernia.    The scope was then withdrawn from the patient and the procedure completed.  COMPLICATIONS: There were no complications.   ENDOSCOPIC IMPRESSION: 1.   The duodenal mucosa showed no abnormalities in the bulb and second portion of the duodenum 2.   The mucosa of the stomach appeared normal 3.   The mucosa of the esophagus appeared normal ..HH psesent and probable nonerosive GERD,R/O eosinophilic esophagitis.  RECOMMENDATIONS: 1.  Await pathology results 2.  Continue current medications    _______________________________ eSigned:  Mardella Layman, MD, [facility] 09/12/2013 9:42 AM   standard discharge

## 2013-09-12 NOTE — Progress Notes (Signed)
Report to pacu rn, vss, bbs=clear 

## 2013-09-12 NOTE — Progress Notes (Signed)
Called to room to assist during endoscopic procedure.  Patient ID and intended procedure confirmed with present staff. Received instructions for my participation in the procedure from the performing physician.  

## 2013-09-13 ENCOUNTER — Encounter: Payer: Self-pay | Admitting: Gastroenterology

## 2013-09-13 ENCOUNTER — Telehealth: Payer: Self-pay | Admitting: *Deleted

## 2013-09-13 ENCOUNTER — Telehealth: Payer: Self-pay

## 2013-09-13 LAB — HELICOBACTER PYLORI SCREEN-BIOPSY: UREASE: POSITIVE

## 2013-09-13 MED ORDER — AMOXICILL-CLARITHRO-LANSOPRAZ PO MISC: Freq: Two times a day (BID) | ORAL | Status: DC

## 2013-09-13 NOTE — Telephone Encounter (Signed)
Left message on answering machine. 

## 2013-09-13 NOTE — Telephone Encounter (Signed)
Disease treated with a Prevpac for 10 days, then resume PPI therapy for 6 more weeks. Hydrogen breath test for H. pylori in 3 month      Informed pt of + H. Pylori and the need for AB. Instructed her on Prev Pak and that we will call in 3 months for breath test to ensure eradication of H. Pylori and she stated understanding.

## 2013-09-14 ENCOUNTER — Telehealth: Payer: Self-pay | Admitting: Gastroenterology

## 2013-09-14 NOTE — Telephone Encounter (Signed)
Spoke with Medicaid, [id number], who states they will cover PrevPak, not the generic. Reordered the drug and pharmacist states he will have to order the drug; should be here tomorrow. Informed pt.

## 2013-09-16 ENCOUNTER — Encounter: Payer: Self-pay | Admitting: Gastroenterology

## 2013-11-01 ENCOUNTER — Ambulatory Visit: Payer: Medicaid Other | Attending: Internal Medicine | Admitting: Internal Medicine

## 2013-11-01 ENCOUNTER — Encounter: Payer: Self-pay | Admitting: Internal Medicine

## 2013-11-01 VITALS — BP 120/87 | HR 96 | Temp 98.3°F | Resp 16 | Wt 145.2 lb

## 2013-11-01 DIAGNOSIS — R635 Abnormal weight gain: Secondary | ICD-10-CM

## 2013-11-01 DIAGNOSIS — H9190 Unspecified hearing loss, unspecified ear: Secondary | ICD-10-CM

## 2013-11-01 DIAGNOSIS — R413 Other amnesia: Secondary | ICD-10-CM

## 2013-11-01 DIAGNOSIS — J309 Allergic rhinitis, unspecified: Secondary | ICD-10-CM | POA: Insufficient documentation

## 2013-11-01 DIAGNOSIS — H9193 Unspecified hearing loss, bilateral: Secondary | ICD-10-CM

## 2013-11-01 LAB — TSH: TSH: 1.347 u[IU]/mL (ref 0.350–4.500)

## 2013-11-01 NOTE — Patient Instructions (Signed)
Allergic Rhinitis Allergic rhinitis is when the mucous membranes in the nose respond to allergens. Allergens are particles in the air that cause your body to have an allergic reaction. This causes you to release allergic antibodies. Through a chain of events, these eventually cause you to release histamine into the blood stream (hence the use of antihistamines). Although meant to be protective to the body, it is this release that causes your discomfort, such as frequent sneezing, congestion and an itchy runny nose.  CAUSES  The pollen allergens may come from grasses, trees, and weeds. This is seasonal allergic rhinitis, or "hay fever." Other allergens cause year-round allergic rhinitis (perennial allergic rhinitis) such as house dust mite allergen, pet dander and mold spores.  SYMPTOMS   Nasal stuffiness (congestion).  Runny, itchy nose with sneezing and tearing of the eyes.  There is often an itching of the mouth, eyes and ears. It cannot be cured, but it can be controlled with medications. DIAGNOSIS  If you are unable to determine the offending allergen, skin or blood testing may find it. TREATMENT   Avoid the allergen.  Medications and allergy shots (immunotherapy) can help.  Hay fever may often be treated with antihistamines in pill or nasal spray forms. Antihistamines block the effects of histamine. There are over-the-counter medicines that may help with nasal congestion and swelling around the eyes. Check with your caregiver before taking or giving this medicine. If the treatment above does not work, there are many new medications your caregiver can prescribe. Stronger medications may be used if initial measures are ineffective. Desensitizing injections can be used if medications and avoidance fails. Desensitization is when a patient is given ongoing shots until the body becomes less sensitive to the allergen. Make sure you follow up with your caregiver if problems continue. SEEK MEDICAL  CARE IF:   You develop fever (more than 100.5 F (38.1 C).  You develop a cough that does not stop easily (persistent).  You have shortness of breath.  You start wheezing.  Symptoms interfere with normal daily activities. Document Released: 08/12/2001 Document Revised: 02/09/2012 Document Reviewed: 02/21/2009 [employer] Patient Information 2014 [employer].  

## 2013-11-01 NOTE — Progress Notes (Signed)
Patient here for some hearing loss She can hear but just not clear past three months Also concerned she has been forgetful lately

## 2013-11-01 NOTE — Progress Notes (Signed)
Patient ID: Brenda Krause, female   DOB: 11-15-1986, 27 y.o.   MRN: [id number] MRN: [id number] Name: Brenda Krause  Sex: female Age: 27 y.o. DOB: 03/23/1986  Allergies: Review of patient's allergies indicates no known allergies.  Chief Complaint  Patient presents with  . Hearing Problem    HPI: Patient is 27 y.o. female who has history of chronic allergic rhinitis, comes to reported to have problem with the hearing in both ears, symptoms has been ongoing for the last 4-5 months, denies any fever chills, as per patient she has already seen allergy doctors and ENT doctors in the past, she also reported to have some memory issues recently on denies any change in vision nausea vomiting, does report that she has gained weight recently.  Past Medical History  Diagnosis Date  . Gastritis   . Esophageal reflux   . Allergic rhinitis, cause unspecified     History reviewed. No pertinent past surgical history.    Medication List       This list is accurate as of: 11/01/13 11:49 AM.  Always use your most recent med list.               amoxicillin-clarithromycin-lansoprazole combo pack  Commonly known as:  PREVPAC  Take by mouth 2 (two) times daily. Follow package directions and take for 10 days.     guaiFENesin 600 MG 12 hr tablet  Commonly known as:  MUCINEX  Take 1,200 mg by mouth once as needed for congestion.     montelukast 10 MG tablet  Commonly known as:  SINGULAIR  Take 10 mg by mouth at bedtime.     pantoprazole 40 MG tablet  Commonly known as:  PROTONIX  Take 1 tablet (40 mg total) by mouth daily.        No orders of the defined types were placed in this encounter.    Immunization History  Administered Date(s) Administered  . Td 02/27/2010    History  Substance Use Topics  . Smoking status: Never Smoker   . Smokeless tobacco: Never Used  . Alcohol Use: No    Review of Systems  As noted in HPI  Filed Vitals:   11/01/13 1123  BP: 120/87  Pulse: 96  Temp:  98.3 F (36.8 C)  Resp: 16    Physical Exam  Physical Exam  Constitutional: No distress.  HENT:  Bilateral nasal congestion ? Left nostril polyp  .    Bilateral auditory canal clear   Left TM whitish ? Sclerosed   Eyes: EOM are normal. Pupils are equal, round, and reactive to light.  Cardiovascular: Normal rate and regular rhythm.   Pulmonary/Chest: Breath sounds normal. No respiratory distress. She has no wheezes. She has no rales.    CBC    Component Value Date/Time   WBC 12.5* 06/21/2013 1902   RBC 4.87 06/21/2013 1902   HGB 11.9* 06/21/2013 1902   HCT 34.3* 06/21/2013 1902   PLT 290 06/21/2013 1902   MCV 70.4* 06/21/2013 1902   LYMPHSABS 2.6 06/21/2013 1902   MONOABS 1.6* 06/21/2013 1902   EOSABS 0.1 06/21/2013 1902   BASOSABS 0.0 06/21/2013 1902    CMP     Component Value Date/Time   NA 137 06/21/2013 1902   K 3.4* 06/21/2013 1902   CL 103 06/21/2013 1902   CO2 26 06/21/2013 1902   GLUCOSE 99 06/21/2013 1902   BUN 11 06/21/2013 1902   CREATININE 0.68 06/21/2013 1902   CALCIUM 9.2 06/21/2013 1902  PROT 8.0 09/06/2013 0956   ALBUMIN 4.2 09/06/2013 0956   AST 18 09/06/2013 0956   ALT 18 09/06/2013 0956   ALKPHOS 50 09/06/2013 0956   BILITOT 0.4 09/06/2013 0956   GFRNONAA >90 06/21/2013 1902   GFRAA >90 06/21/2013 1902    Lab Results  Component Value Date/Time   CHOL 162 03/14/2010  9:56 PM    No components found with this basename: hga1c    Lab Results  Component Value Date/Time   AST 18 09/06/2013  9:56 AM    Assessment and Plan  Hearing problem, bilateral ... no active sign and symptoms of infection, patient is doing tobacco follow up appointment with her ENT doctor.  Allergic rhinitis... continue with nasal spray  Memory change - Plan: Will check Vitamin B12, TSH  Weight gain - Plan: TSH    Return in about 6 weeks (around 12/13/2013).  Doris Cheadle, MD

## 2013-11-08 ENCOUNTER — Telehealth: Payer: Self-pay | Admitting: Internal Medicine

## 2013-11-08 DIAGNOSIS — H9209 Otalgia, unspecified ear: Secondary | ICD-10-CM

## 2013-11-08 NOTE — Telephone Encounter (Signed)
Pt calling because she would like a referral to an Ear, Nose, Throat specialist, pt looking to change her specialty Dr.  °

## 2013-11-08 NOTE — Telephone Encounter (Signed)
Pt calling because she would like a referral to an Ear, Nose, Throat specialist, pt looking to change her specialty Dr.

## 2013-12-13 ENCOUNTER — Ambulatory Visit: Payer: Medicaid Other

## 2013-12-16 ENCOUNTER — Telehealth: Payer: Self-pay | Admitting: *Deleted

## 2013-12-16 DIAGNOSIS — A048 Other specified bacterial intestinal infections: Secondary | ICD-10-CM

## 2013-12-16 NOTE — Telephone Encounter (Signed)
lmom for pt to call back

## 2013-12-16 NOTE — Telephone Encounter (Signed)
Message copied by Lance Morin on Fri Dec 16, 2013 11:40 AM ------      Message from: Lance Morin      Created: Tue Sep 13, 2013 10:49 AM       Do breath test 3 months post tx for h.pylori ------

## 2013-12-19 NOTE — Telephone Encounter (Signed)
Pt called and asked to be scheduled next week for the Breath Test. Scheduled for 12/28/13 at 7:45am. lmom for pt to call back.

## 2013-12-19 NOTE — Telephone Encounter (Signed)
Left message for pt that if she is still on a PPI, I will have to r/s her test. If no longer on pantoprazole we can fo the test next week.

## 2013-12-22 NOTE — Telephone Encounter (Signed)
Pt left me a message stating she hasn't had any PPIs in a long time. Left her a message to call back. She is scheduled for the Breath Test on 12/28/13 at 07:45AM; ARRIVE AT 07:30AM

## 2013-12-23 NOTE — Telephone Encounter (Signed)
Informed pt of appt time for HP Breath Test and mailed her instructions; pt stated understanding.

## 2013-12-28 ENCOUNTER — Ambulatory Visit (HOSPITAL_COMMUNITY)
Admission: RE | Admit: 2013-12-28 | Discharge: 2013-12-28 | Disposition: A | Discharge status: Home / Self Care | Payer: Medicaid Other | Source: Ambulatory Visit | Attending: Gastroenterology | Admitting: Gastroenterology

## 2013-12-28 ENCOUNTER — Encounter (HOSPITAL_COMMUNITY)
Admission: RE | Disposition: A | Discharge status: Home / Self Care | Payer: Medicaid Other | Source: Ambulatory Visit | Attending: Gastroenterology

## 2013-12-28 DIAGNOSIS — K219 Gastro-esophageal reflux disease without esophagitis: Secondary | ICD-10-CM | POA: Insufficient documentation

## 2013-12-28 DIAGNOSIS — A048 Other specified bacterial intestinal infections: Secondary | ICD-10-CM | POA: Insufficient documentation

## 2013-12-28 HISTORY — PX: BREATH TEK H PYLORI: SHX5422

## 2013-12-28 SURGERY — BREATH TEST, FOR HELICOBACTER PYLORI

## 2013-12-28 NOTE — Progress Notes (Signed)
12/28/13 [place]  Referring MD Verl Blalock  Time of Last PO Intake 1938 (on 12/27/2013)  Baseline Breath At: 0747  Pranactin Given At: 0749  Post-Dose Breath At: 0804  Sample 1 2.9  Sample 2 2.7  Test Negative  Comments sent to Dr. Sharlett Iles in [place]

## 2013-12-29 ENCOUNTER — Encounter (HOSPITAL_COMMUNITY): Payer: Self-pay | Admitting: Gastroenterology

## 2014-02-08 ENCOUNTER — Encounter: Payer: Self-pay | Admitting: Neurology

## 2014-02-09 ENCOUNTER — Encounter: Payer: Self-pay | Admitting: Neurology

## 2014-02-09 ENCOUNTER — Ambulatory Visit (INDEPENDENT_AMBULATORY_CARE_PROVIDER_SITE_OTHER): Payer: Medicaid Other | Admitting: Neurology

## 2014-02-09 ENCOUNTER — Encounter (INDEPENDENT_AMBULATORY_CARE_PROVIDER_SITE_OTHER): Payer: Self-pay

## 2014-02-09 VITALS — BP 111/74 | HR 83 | Ht 62.75 in | Wt 145.0 lb

## 2014-02-09 DIAGNOSIS — R51 Headache: Secondary | ICD-10-CM

## 2014-02-09 DIAGNOSIS — H539 Unspecified visual disturbance: Secondary | ICD-10-CM

## 2014-02-09 NOTE — Patient Instructions (Signed)
Overall you are doing fairly well but I do want to suggest a few things today:   Remember to drink plenty of fluid, eat healthy meals and do not skip any meals. Try to eat protein with a every meal and eat a healthy snack such as fruit or nuts in between meals. Try to keep a regular sleep-wake schedule and try to exercise daily, particularly in the form of walking, 20-30 minutes a day, if you can.   As far as diagnostic testing:  1)Please have a MRI of your brain completed. We will call you to schedule this  Please follow up with an eye doctor for a formal eye exam. If you do not have an eye doctor please notify us and we will find you one  Follow up once the workup is completed. Please call us with any interim questions, concerns, problems, updates or refill requests.   My clinical assistant and will answer any of your questions and relay your messages to me and also relay most of my messages to you.   Our phone number is [phone]. We also have an after hours call service for urgent matters and there is a physician on-call for urgent questions. For any emergencies you know to call 911 or go to the nearest emergency room

## 2014-02-09 NOTE — Progress Notes (Signed)
[facility]    Provider:  Dr Janann Colonel Referring Provider: Jodi Marble, MD Primary Care Physician:  Angelica Chessman, MD  CC:  headache  HPI:  Brenda Krause is a 28 y.o. female here as a referral from Dr. Erik Obey for headache and tinnitus  Has been having headaches for years. Located in the occipital region, states it has been there for years. Reports it is constant and fluctuates. Describes as a pulling sensation, a retrocollis type sensation. Is made worse with stress and anxiety. No nausea or emesis. No photo or phonophobia. Notes having episodes of blurry vision associated with the headaches. Has not had an eye exam done in years. No focal motor or sensory changes. Gets occasional dizziness, no vertigo. Typically only gets 5 hours of sleep a night. No history of neck trauma.  No family history of migraines or other headaches.   ENT notes: Decreased hearing for years, bilateral non-pulsatile tinnitus for years. Occipital headache, felt to be MSK in nature. Audiogram normal, referred to [facility] for evaluation of central auditory processing disorder.   Review of Systems: Out of a complete 14 system review, the patient complains of only the following symptoms, and all other reviewed systems are negative. + memory loss, headache, snoring, shortness of breath  History   Social History  . Marital Status: Single    Spouse Name: N/A    Number of Children: 2  . Years of Education: 12   Occupational History  .     Social History Main Topics  . Smoking status: Never Smoker   . Smokeless tobacco: Never Used  . Alcohol Use: No  . Drug Use: No  . Sexual Activity: Not on file   Other Topics Concern  . Not on file   Social History Narrative   Patient is single, has 2 children   Patient is right handed   Education level 12   Caffeine consumption is 1 cup daily    Family History  Problem Relation Age of Onset  . Hearing loss Father   . Hearing loss Brother     . Migraines Mother   . Migraines Father     Past Medical History  Diagnosis Date  . Gastritis   . Esophageal reflux   . Allergic rhinitis, cause unspecified   . Secondhand smoke exposure     Past Surgical History  Procedure Laterality Date  . Breath tek h pylori N/A 12/28/2013    Procedure: BREATH TEK H PYLORI;  Surgeon: Sable Feil, MD;  Location: [facility];  Service: Endoscopy;  Laterality: N/A;  . Tooth extraction      Current Outpatient Prescriptions  Medication Sig Dispense Refill  . amoxicillin-clarithromycin-lansoprazole (PREVPAC) combo pack Take by mouth 2 (two) times daily. Follow package directions and take for 10 days.  1 kit  0  . fluticasone (FLONASE) 50 MCG/ACT nasal spray Place 2 sprays into both nostrils daily.      Marland Kitchen guaiFENesin (MUCINEX) 600 MG 12 hr tablet Take 1,200 mg by mouth once as needed for congestion.      . montelukast (SINGULAIR) 10 MG tablet Take 10 mg by mouth at bedtime.      . pantoprazole (PROTONIX) 40 MG tablet Take 1 tablet (40 mg total) by mouth daily.  90 tablet  3   No current facility-administered medications for this visit.    Allergies as of 02/09/2014 - Review Complete 02/09/2014  Allergen Reaction Noted  . Amoxicillin  02/08/2014    Vitals: BP 111/74  Pulse 83  Ht 5' 2.75" (1.594 m)  Wt 145 lb (65.772 kg)  BMI 25.89 kg/m2 Last Weight:  Wt Readings from Last 1 Encounters:  02/09/14 145 lb (65.772 kg)   Last Height:   Ht Readings from Last 1 Encounters:  02/09/14 5' 2.75" (1.594 m)     Physical exam: Exam: Gen: NAD, conversant Eyes: anicteric sclerae, moist conjunctivae HENT: Atraumatic, oropharynx clear Neck: Trachea midline; supple,  Lungs: CTA, no wheezing, rales, rhonic                          CV: RRR, no MRG Abdomen: Soft, non-tender;  Extremities: No peripheral edema  Skin: Normal temperature, no rash,  Psych: Appropriate affect, pleasant  Neuro: MS: AA&Ox3, appropriately interactive, normal  affect   Speech: fluent w/o paraphasic error  Memory: good recent and remote recall  CN: PERRL, VFF to FC bilat, unable to fully visualize optic disc due to pupil size, EOMI no nystagmus, no ptosis, sensation intact to LT V1-V3 bilat, face symmetric, no weakness, hearing grossly intact, palate elevates symmetrically, shoulder shrug 5/5 bilat,  tongue protrudes midline, no fasiculations noted.  Motor: normal bulk and tone, no cervical tenderness, no abnormal dystonic postures noted Strength: 5/5  In all extremities  Coord: rapid alternating and point-to-point (FNF, HTS) movements intact.  Reflexes: symmetrical, bilat downgoing toes  Sens: LT intact in all extremities  Gait: posture, stance, stride and arm-swing normal. Tandem gait intact. Able to walk on heels and toes. Romberg absent.   Assessment:  After physical and neurologic examination, review of laboratory studies, imaging, neurophysiology testing and pre-existing records, assessment will be reviewed on the problem list.  Plan:  Treatment plan and additional workup will be reviewed under Problem List.  1)Headache 2)Visual changes 27y/o woman presenting for initial evaluation of chronic occipital headache. Unclear etiology of her symptoms. Based on description would be consistent with a diagnosis of a chronic tension type headache. Chronic headache with vision changes raises question of possible IIH. Unable to fully visualize optic discs so will check MRI and refer to optometry for formal eye exam. Sensation of pulling and chronic pain raises question of a cervical dystonia but exam not currently consistent with that diagnosis. Will follow up once MRI and optometry evaluation completed.     Jim Like, DO  [facility] [address] [place], [place] [address]  Phone [phone] Fax [phone]

## 2014-02-17 ENCOUNTER — Ambulatory Visit
Admission: RE | Admit: 2014-02-17 | Discharge: 2014-02-17 | Disposition: A | Discharge status: Home / Self Care | Payer: Medicaid Other | Source: Ambulatory Visit | Attending: Neurology | Admitting: Neurology

## 2014-02-17 DIAGNOSIS — R51 Headache: Secondary | ICD-10-CM

## 2014-05-02 ENCOUNTER — Ambulatory Visit: Payer: Medicaid Other | Attending: Otolaryngology | Admitting: Audiology

## 2014-05-02 DIAGNOSIS — H9325 Central auditory processing disorder: Secondary | ICD-10-CM | POA: Insufficient documentation

## 2014-05-02 DIAGNOSIS — H9319 Tinnitus, unspecified ear: Secondary | ICD-10-CM | POA: Insufficient documentation

## 2014-05-02 DIAGNOSIS — H748X9 Other specified disorders of middle ear and mastoid, unspecified ear: Secondary | ICD-10-CM | POA: Diagnosis not present

## 2014-05-02 DIAGNOSIS — H748X2 Other specified disorders of left middle ear and mastoid: Secondary | ICD-10-CM

## 2014-05-02 NOTE — Patient Instructions (Signed)
Conclusions: Brenda Krause has normal hearing thresholds in each ear. However, her inner ear function is weak, which may or may not be an artifact of "chronic allergies".  The middle ear function is borderline bilaterally for pressure without tympanic membrane redness.  Brenda Krause has normal ipsilateral acoustic reflexes and elevated contralateral acoustic reflexes on the left side with negative acoustic reflex decay.  The right side has elevated to absent ipsilateral and contralateral acoustic reflexes with inconclusive acoustic reflex decay because the results were so shallow. A brainstem evoked response test (BAER) was completed at a slow and rapid rate and was within normal limits bilaterally.  The Central Auditory Processing test battery is positive for central auditory processing disorder. She also has poor word recognition in minimal background noise.  Recommendations: 1.   Based on the results  Brenda Krause has incorrect identification of individual speech sounds (phonemes), in quiet.  Decoding of speech and speech sounds should occur quickly and accurately. However, if it does not it may be difficult to: develop clear speech, understand what is said, have good oral reading/word accuracy/word finding/receptive language/ spelling.  The goal of decoding therapy is to imprpve understanding of the speech sounds. Improvement in decoding is often addressed first because improvement here, helps hearing in background noise and other areas.  Benefit has been shown with intensive use for 10-15 minutes,  4-5 days per week for 5-8 weeks for each of these programs.  Research is suggesting that using the programs for a short amount of time each day is better for the auditory processing development than completing the program in a short amount of time by doing it several hours per day.     Auditory Workout          IPAD only from [employer]   2.  Other self-help measures include: 1) have conversation face to face  2) minimize  background noise when having a conversation- turn off the TV, move to a quiet area of the area 3) be aware that auditory processing problems become worse with fatigue and stress  4) Avoid having important conversation in the kitchen, especially when the water is running, water is boiling or your back is to the speaker.  3.  Monitor hearing at home and contact Dr. Erik Obey, ENT for any change in hearing.  It  Is also recommended that Brenda Krause have her hearing closely monitored because of the family history of hearing loss, her poor word recognition in minimal background noise, elevated acoustic reflexes and weak/abnormal inner ear function.  Brenda Krause, Au.D., CCC-A Doctor of Audiology 05/02/2014

## 2014-05-02 NOTE — Procedures (Signed)
[facility] and [place], [place]  [address] [phone]  AUDIOLOGICAL AND AUDITORY PROCESSING EVALUATION  NAME: Brenda Krause   STATUS: Outpatient DOB:   1986/11/09   DIAGNOSIS: Evaluate for Central auditory                                                                                    processing disorder           MRN: [id number]                                                                                      DATE: 05/02/2014   REFERENT: Dr. Kennith Center, ENT  HISTORY: Brenda Krause,  was seen for an audiological and central auditory processing evaluation. Brenda Krause states that she has "difficulty hearing and understanding" especially in the "left ear" and that her "family is concerned".  Brenda Krause also states that she has "been having difficulty remembering things the past two years".  She states that she will go into a room and not remember why she is there. She states that she first noticed this a couple of years ago when she "was trying to go to [facility]".  Brenda Krause states that she would "look at the page, memorize what was there and then not be able to remember it".   Brenda Krause reports a significant history of "allergies" for which she has medication and "allergy shorts". She also reports high pitched "ringing in both ears every night" that one a scale of 1 (no impact) to 10 (ruined) she rates as "5".   Brenda Krause also states that "noisy areas lead to a headache".   Significant is that Brenda Krause brother was born deaf, had a "cochlear implant at age 72 and is now 78 years of age".  Her father also "has hearing loss".  Brenda Krause had an audiological evaluation in December 2014 that was normal at [facility].  EVALUATION: Pure tone air conduction testing showed 5-15 dBHL from 250Hz  - 8000Hz  except for a 20 dBHL hearing threshold on the left side at 2000Hz  only.  Speech reception thresholds are 10 dBHL on the left and 10 dBHL on the right using  recorded spondee word lists. Word recognition was 92%% at 50 dBHL on the left at and 94%% at 50 dBHL on the right using recorded NU-6 word lists, in quiet.  Otoscopic inspection reveals clear ear canals with visible tympanic membranes.  Tympanometry showed normal middle ear volume and pressure bilaterally with good mobility on the right side (Type A) and slightly shallow mobility on the left side (Type As).  Ipsilateral and contralateral acoustic reflexes are elevated or absent on the right side and also on the left contralateral reflexes. The left ipsilateral acoustic reflexes are within normal limits.  Right acoustic reflex decay is weak and  inconclusive but the left side was well within normal limits. Tone delay at 1000Hz  was negative bilaterally.  Distortion Product Otoacoustic Emissions (DPOAE) testing showed present and weak responses bilaterally which although overall abnormal, are stronger on the right side than the left.  Brainstem Auditory Evoked Response (BAER) was completed because of the abnormal acoustic reflexes and was within normal limits bilaterally for the slow click rate of 40.98/JXB as well as for the fast click rate of 14.78.  The waveform morphology and latencies are within normal limits for Wave I, III and V bilaterally.  Tinnitus matching was 8000Hz  using pure tone at approximately 37 dBHL with no suppression.   A summary of Brenda Krause's central auditory processing evaluation is as follows.  Please note that for analysis purposes that English is a second language even though Brenda Krause is very articulate and has very little accent. For this reason the [facility] CAPD testing model was used, because it has less language influence: Uncomfortable Loudness Testing was performed using speech noise.  Brenda Krause reported that noise levels of 105 dBHL were uncomfortably loud which is within normal limits.   Speech-in-Noise testing was performed to determine speech discrimination in the presence of background noise.   Brenda Krause scored 72 % in the right ear and 50 % in the left ear, when noise was presented 5 dB below speech. Brenda Krause is expected to have significant difficulty hearing and understanding in minimal background noise.       The Phonemic Synthesis test was administered to assess decoding and sound blending skills through word reception.  Brenda Krause's quantitative score was 6 correct which indicates a severe decoding and sound-blending deficit, even in quiet.  Remediation with computer based auditory processing programs and/or a speech pathologist is recommended.  Phoneme Recognition showed 29/34 correct  which supports a significant decoding deficit. For /r/ she said /brrr/ For /ee/ she said /dee/ For /uh/ she said /ah/  For /ah/ she said /fah/     For /m/ she said /n/  Competing Sentences (CS) involved a different sentences being presented to each ear at different volumes. The instructions are to repeat the softer volume sentences. Posterior temporal issues will show poorer performance in the ear contralateral to the lobe involved.  Brenda Krause scored 90% in the right ear (abnormal) and 30% in the left ear (abnormal).  The test results are abnormal bilaterally and are consistent with a central auditory processing disorder.  Dichotic Digits (DD) presents different two digits to each ear. All four digits are to be repeated. Poor performance suggests that cerebellar and/or brainstem may be involved. Brenda Krause scored 77.5% (abnormal) in the right ear and 92.5% (normal) in the left ear. The test results indicate that Brenda Krause scored abnormal on the right side which is indicative of a central auditory processing disorder.  Musiek's Frequency (Pitch) Pattern Test requires identification of high and low pitch tones presented each ear individually. Poor performance may occur with organization, learning issues or dylexia.  Brenda Krause scored 60% on the left and 65% on the right which is abnormal bilaterally and is consistent with a central auditory processing  disorder.   CONCLUSIONS: Brenda Krause has normal hearing thresholds in each ear. However, her inner ear function is weak, which may or may not be an artifact of "chronic allergies".  The middle ear function is normal on the right but borderline on the left for pressure and tympanic membrane movement without tympanic membrane redness.  Brenda Krause has some possibly retrocochlear findings: 1)  normal ipsilateral acoustic  reflexes and abnormal contralateral acoustic reflexes on the left side  2) The right side has elevated to absent ipsilateral and contralateral acoustic reflexes with inconclusive acoustic reflex decay because the results were so shallow.  3) Tinnitus without suppression.  A brainstem evoked response test (BAER) was completed at a slow and rapid rate and was within normal limits bilaterally. Results that support normal retrocochlear function include 1) negative tone decay bilaterally at 1000Hz , 2) acoustic reflex decay that is negative on the left side but is weak or inconclusive on the right side.   It is recommended that Mrs. Budhan continue to be monitored audiologically at least every 6-12 months until stability of these findings is verified.   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 Brenda Krause also has poor word recognition in minimal background noise.  The most severe CAPD area is that of decoding where the phonemic synthesis test was at the early 1st grade level. 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  The at home auditory processing computer program was strongly recommended.  If Brenda Krause uses it consistently, the phonemic synthesis test should be repeated at the next audiological evaluation to determine benefit. She also has difficulty when a competing message is present.    Of concern is Brenda Krause report that she has recently  experienced much more difficulty hearing, processing information and remembering in the past two years.  Taking this into consideration I am reluctant to say that Brenda. Budhan's only issue is auditory processing.  I think that close monitoring, repeating the problem areas in 6 months needs to be done.  If the results remain stable, then I would feel more confident in that today's snapshot of her function is only an auditory processing disorder.   RECOMMENDATIONS: 1.   Based on the results  Brenda Krause has incorrect identification of individual speech sounds (phonemes), in quiet.  Decoding of speech and speech sounds should occur quickly and accurately. However, if it does not it may be difficult to: develop clear speech, understand what is said, have good oral reading/word accuracy/word finding/receptive language/ spelling.  The goal of decoding therapy is to improve understanding of the speech sounds. Improvement in decoding is often addressed first because improvement here, helps hearing in background noise and other areas. Benefit has been shown with intensive use for 10-15 minutes,  4-5 days per week for 5-8 weeks for each of these programs.  Research is suggesting that using the programs for a short amount of time each day is better for the auditory processing development than completing the program in a short amount of time by doing it several hours per day.  Auditory Workout         IPAD only from [employer]. To monitor progress, repeat the phonemic synthesis test in 6 months.  2.  Other self-help measures include: 1) have conversation face to face  2) minimize background noise when having a conversation- turn off the TV, move to a quiet area of the area 3) be aware that auditory processing problems become worse with fatigue and stress  4) Avoid having important conversation in the kitchen, especially when the water is running, water is boiling or your back is to the speaker.  3.  Monitor hearing at home and  contact Dr. Erik Obey, ENT for any change in hearing.  It  Is also recommended that Brenda Krause have  her hearing closely monitored because of the family history of hearing loss, her poor word recognition in minimal background noise, elevated acoustic reflexes and weak/abnormal inner ear function.  Repeat audiological evaluation and the CAPD tests that were abnormal in 6 months to determinate whether her function is stable or progressive.     Kaniya Trueheart L. Heide Spark, Au.D., CCC-A Doctor of Audiology 05/02/2014

## 2014-05-09 NOTE — Procedures (Signed)
Addendum:  The important words DO NOT were left out of this sentence and are added in correctly below.  Ms. Brenda Krause has normal hearing thresholds in each ear. However, her inner ear function is weak, which may or may not be an artifact of "chronic allergies". The middle ear function is normal on the right but borderline on the left for pressure and tympanic membrane movement without tympanic membrane redness. [name] has some possibly retrocochlear findings: 1) normal ipsilateral acoustic reflexes and abnormal contralateral acoustic reflexes on the left side 2) The right side has elevated to absent ipsilateral and contralateral acoustic reflexes with inconclusive acoustic reflex decay because the results were so shallow. 3) Tinnitus without suppression. A brainstem evoked response test (BAER) was completed at a slow and rapid rate and was within normal limits bilaterally. Results that DO NOT support normal retrocochlear function include 1) negative tone decay bilaterally at 1000Hz , 2) acoustic reflex decay that is negative on the left side but is weak or inconclusive on the right side. It is recommended that Brenda Krause continue to be monitored audiologically at least every 6-12 months until stability of these findings is verified.   Trask Vosler L. Heide Spark, Au.D., CCC-A Doctor of Audiology

## 2015-04-10 ENCOUNTER — Ambulatory Visit
Admission: RE | Admit: 2015-04-10 | Discharge: 2015-04-10 | Disposition: A | Discharge status: Home / Self Care | Payer: Medicaid Other | Source: Ambulatory Visit | Attending: Allergy and Immunology | Admitting: Allergy and Immunology

## 2015-04-10 ENCOUNTER — Other Ambulatory Visit: Payer: Self-pay | Admitting: Allergy and Immunology

## 2015-04-10 DIAGNOSIS — J329 Chronic sinusitis, unspecified: Secondary | ICD-10-CM

## 2016-01-30 ENCOUNTER — Telehealth: Payer: Self-pay | Admitting: Allergy and Immunology

## 2016-04-02 ENCOUNTER — Ambulatory Visit: Payer: Medicaid Other | Admitting: Gastroenterology

## 2016-07-30 NOTE — Telephone Encounter (Signed)
Made in error

## 2017-06-11 ENCOUNTER — Other Ambulatory Visit (HOSPITAL_COMMUNITY): Payer: Self-pay | Admitting: Family Medicine

## 2017-06-11 DIAGNOSIS — R1310 Dysphagia, unspecified: Secondary | ICD-10-CM

## 2017-06-23 ENCOUNTER — Ambulatory Visit (HOSPITAL_COMMUNITY)
Admission: RE | Admit: 2017-06-23 | Discharge: 2017-06-23 | Disposition: A | Discharge status: Home / Self Care | Payer: BLUE CROSS/BLUE SHIELD | Source: Ambulatory Visit | Attending: Family Medicine | Admitting: Family Medicine

## 2017-06-23 DIAGNOSIS — R1310 Dysphagia, unspecified: Secondary | ICD-10-CM | POA: Diagnosis present

## 2017-06-23 DIAGNOSIS — E041 Nontoxic single thyroid nodule: Secondary | ICD-10-CM | POA: Insufficient documentation

## 2017-06-23 DIAGNOSIS — K222 Esophageal obstruction: Secondary | ICD-10-CM | POA: Diagnosis not present

## 2017-09-16 ENCOUNTER — Ambulatory Visit: Payer: BLUE CROSS/BLUE SHIELD | Admitting: Gastroenterology

## 2017-10-06 ENCOUNTER — Ambulatory Visit: Payer: BLUE CROSS/BLUE SHIELD | Admitting: Family Medicine

## 2017-10-06 ENCOUNTER — Encounter: Payer: Self-pay | Admitting: Family Medicine

## 2017-10-06 VITALS — BP 100/80 | HR 78 | Ht 61.0 in | Wt 144.2 lb

## 2017-10-06 DIAGNOSIS — R1310 Dysphagia, unspecified: Secondary | ICD-10-CM

## 2017-10-06 DIAGNOSIS — Z23 Encounter for immunization: Secondary | ICD-10-CM

## 2017-10-06 NOTE — Progress Notes (Signed)
Subjective:  Brenda Krause is a 31 y.o. female who presents today with a chief complaint of dysphasia.   HPI:  Dysphagia, new issue Several year history.  She has seen GI in the past several years ago.  She is taking Nexium 40 mg once daily.  Symptoms have worsened over the past few months.  She now has symptoms of chest discomfort and feeling like food gets stuck in her chest after eating both solids and liquids.  She had a barium swallow done about 4 months ago which showed distal esophageal stricture.  She has an appointment with GI scheduled for next month.  No weight loss.  No night sweats.  No fevers or chills.  ROS: Per HPI, otherwise a 14 point review of systems was performed and was negative  PMH:  The following were reviewed and entered/updated in epic: Past Medical History:  Diagnosis Date  . Allergic rhinitis, cause unspecified   . Esophageal reflux   . Gastritis   . Secondhand smoke exposure    Patient Active Problem List   Diagnosis Date Noted  . Dysphagia 10/06/2017  . GERD (gastroesophageal reflux disease) 06/29/2013  . HEADACHE 10/29/2010  . ALLERGIC RHINITIS 02/27/2010   Past Surgical History:  Procedure Laterality Date  . TOOTH EXTRACTION      Family History  Problem Relation Age of Onset  . Migraines Mother   . Hypertension Mother   . Hearing loss Father   . Migraines Father   . Hypertension Father   . Hearing loss Brother     Medications- reviewed and updated Current Outpatient Medications  Medication Sig Dispense Refill  . esomeprazole (NEXIUM) 40 MG capsule Take 40 mg daily by mouth.  0  . fluticasone (FLONASE) 50 MCG/ACT nasal spray Place 2 sprays into both nostrils daily.    Marland Kitchen guaiFENesin (MUCINEX) 600 MG 12 hr tablet Take 1,200 mg by mouth once as needed for congestion.    . MedroxyPROGESTERone Acetate 150 MG/ML SUSY INJECT 1 ML Q 3 MONTHS  3  . montelukast (SINGULAIR) 10 MG tablet Take 10 mg by mouth at bedtime.     No current  facility-administered medications for this visit.     Allergies-reviewed and updated No Known Allergies  Social History   Socioeconomic History  . Marital status: Single    Spouse name: None  . Number of children: 2  . Years of education: 11  . Highest education level: None  Social Needs  . Financial resource strain: None  . Food insecurity - worry: None  . Food insecurity - inability: None  . Transportation needs - medical: None  . Transportation needs - non-medical: None  Occupational History    Employer: [employer]  Tobacco Use  . Smoking status: Never Smoker  . Smokeless tobacco: Never Used  Substance and Sexual Activity  . Alcohol use: Yes    Comment: Social   . Drug use: No  . Sexual activity: Yes  Other Topics Concern  . None  Social History Narrative   Patient is single, has 2 children   Patient is right handed   Education level 12   Caffeine consumption is 1 cup daily   Objective:  Physical Exam: BP 100/80   Pulse 78   Ht 5\' 1"  (1.549 m)   Wt 144 lb 3.2 oz (65.4 kg)   SpO2 98%   BMI 27.25 kg/m   Gen: NAD, resting comfortably CV: RRR with no murmurs appreciated Pulm: NWOB, CTAB with no crackles,  wheezes, or rhonchi GI: Normal bowel sounds present. Soft, Nontender, Nondistended. MSK: No edema, cyanosis, or clubbing noted Skin: Warm, dry Neuro: Grossly normal, moves all extremities Psych: Normal affect and thought content  Assessment/Plan:  Dysphagia Likely related to patient's reflux and distal esophageal stricture.  Patient has follow-up with GI already scheduled.  Advised patient to increase Nexium to twice daily to see if this helps with symptoms. Will defer further management to GI.  Discussed reasons to seek care sooner.  Preventative healthcare Up-to-date on Pap.  Flu shot given today.  Patient will return soon for her complete physical exam.  Caleb M. Jerline Pain, MD 10/06/2017 10:50 AM

## 2017-10-06 NOTE — Assessment & Plan Note (Addendum)
Likely related to patient's reflux and distal esophageal stricture.  Patient has follow-up with GI already scheduled.  Advised patient to increase Nexium to twice daily to see if this helps with symptoms. Will defer further management to GI.  Discussed reasons to seek care sooner.

## 2017-10-06 NOTE — Patient Instructions (Signed)
Increase nexium to twice daily. Please see GI for further evaluation.   Come back to see me in a few months for your physical.  Take care,  Dr Jerline Pain

## 2017-11-17 ENCOUNTER — Encounter (INDEPENDENT_AMBULATORY_CARE_PROVIDER_SITE_OTHER): Payer: Self-pay

## 2017-11-17 ENCOUNTER — Ambulatory Visit: Payer: BLUE CROSS/BLUE SHIELD | Admitting: Gastroenterology

## 2017-11-17 ENCOUNTER — Encounter: Payer: Self-pay | Admitting: Gastroenterology

## 2017-11-17 VITALS — BP 100/68 | HR 80 | Ht 60.63 in | Wt 147.0 lb

## 2017-11-17 DIAGNOSIS — R1013 Epigastric pain: Secondary | ICD-10-CM

## 2017-11-17 DIAGNOSIS — R1310 Dysphagia, unspecified: Secondary | ICD-10-CM | POA: Diagnosis not present

## 2017-11-17 DIAGNOSIS — K219 Gastro-esophageal reflux disease without esophagitis: Secondary | ICD-10-CM

## 2017-11-17 MED ORDER — ESOMEPRAZOLE MAGNESIUM 40 MG PO CPDR: 40.0000 mg | DELAYED_RELEASE_CAPSULE | Freq: Two times a day (BID) | ORAL | 1 refills | Status: DC

## 2017-11-17 NOTE — Progress Notes (Signed)
HPI :  31 y/o female with a history of GERD, H pylori, allergic rhinitis. She was last seen by Dr. Sharlett Iles in 2014, referred by Dr. Aura Dials for GERD and dysphagia.   She reports she has been experiencing dysphagia for the past year or so. Mostly to solids but has some liquid dysphagia. She usually feels this in the sternal notch. She has some heartburn which bothers her as well as regurgitation sometimes, usually by eating foods which can precipitate it such as spicy things. She has been on Nexium 40 mg once daily previously, and recently increased it to twice daily which has had a good effect on her reflux symptoms and generally controls it a little bit better. She is having some occasional epigastric pain although it is not related to eating at all. She thinks her epigastric pain is usually related to movements, such as when she sits up. No weight loss or alarm symptoms.   She had an esophagram done in July of this year for her dysphagia which was suggestive of a distal esophageal stricture at which time tablet was delayed exiting her esophagus in this area. She's had a prior ultrasound of her abdomen which did not show any gallstones. She had a prior EGD in 2014 showing H. pylori gastritis, as was treated with a negative follow-up H. pylori breath test in 2015. She asks if she could have recurrent H. pylori.  Prior workup: US abdomen - 06/21/13 - normal EGD 09/12/2013 - 3cm HH, otherwise normal - normal esophageal biopsies Urease test positive for H pylori Eradication testing for H pylori negative breath test 12/30/2013  Esophogram 06/23/17 - distal esophageal stricture, delayed tablet - low grade peptic stricture  Past Medical History:  Diagnosis Date  . Allergic rhinitis, cause unspecified   . Esophageal reflux   . Gastritis   . Secondhand smoke exposure      Past Surgical History:  Procedure Laterality Date  . BREATH TEK H PYLORI N/A 12/28/2013   Procedure: BREATH TEK H  PYLORI;  Surgeon: Sable Feil, MD;  Location: [facility];  Service: Endoscopy;  Laterality: N/A;  . TOOTH EXTRACTION     Family History  Problem Relation Age of Onset  . Migraines Mother   . Hypertension Mother   . Hearing loss Father   . Migraines Father   . Hypertension Father   . Hearing loss Brother    Social History   Tobacco Use  . Smoking status: Never Smoker  . Smokeless tobacco: Never Used  Substance Use Topics  . Alcohol use: Yes    Comment: Social   . Drug use: No   Current Outpatient Medications  Medication Sig Dispense Refill  . esomeprazole (NEXIUM) 40 MG capsule Take 1 capsule (40 mg total) by mouth 2 (two) times daily before a meal. 180 capsule 1  . fluticasone (FLONASE) 50 MCG/ACT nasal spray Place 2 sprays into both nostrils daily.    Marland Kitchen guaiFENesin (MUCINEX) 600 MG 12 hr tablet Take 1,200 mg by mouth once as needed for congestion.    . MedroxyPROGESTERone Acetate 150 MG/ML SUSY INJECT 1 ML Q 3 MONTHS  3  . montelukast (SINGULAIR) 10 MG tablet Take 10 mg by mouth at bedtime.     No current facility-administered medications for this visit.    No Known Allergies   Review of Systems: All systems reviewed and negative except where noted in HPI.    Physical Exam: BP 100/68 (BP Location: Left Arm, Patient Position:  Sitting, Cuff Size: Normal)   Pulse 80   Ht 5' 0.63" (1.54 m) Comment: height measured without shoes  Wt 66.7 kg (147 lb)   BMI 28.12 kg/m  Constitutional: Pleasant,well-developed, female in no acute distress. HEENT: Normocephalic and atraumatic. Conjunctivae are normal. No scleral icterus. Neck supple.  Cardiovascular: Normal rate, regular rhythm.  Pulmonary/chest: Effort normal and breath sounds normal. No wheezing, rales or rhonchi. Abdominal: Soft, nondistended, mild epigastric TTP, negative Carnett. There are no masses palpable. No hepatomegaly. Extremities: no edema Lymphadenopathy: No cervical adenopathy noted. Neurological:  Alert and oriented to person place and time. Skin: Skin is warm and dry. No rashes noted. Psychiatric: Normal mood and affect. Behavior is normal.   ASSESSMENT AND PLAN: 31 year old female here for reassessment of the following issues:  Dysphagia - suspect related to findings noted on barium swallow, likely low-grade distal peptic stricture. I recommended an EGD with dilation of this area to see if we can improve her symptoms. I discussed risks and benefits of EGD and anesthesia with her, following this discussion she wanted to proceed. Further recommendations pending the result.  GERD - seems better improved on twice daily PPI. We discussed long-term risks of PPIs in general, recommend low-dose dose needed to control symptoms. She feels twice daily dose is indicated at this time given breakthrough symptoms on once daily dosing. We'll await EGD to assess for esophagitis.Marland Kitchen  Epigastric pain / history of H. Pylori - we'll plan on biopsies to check for recurrent H. pylori during her EGD, although her symptoms are not typical for gastritis. Suspect she may have musculoskeletal or abdominal wall pain, given she only has pain with positional changes, although negative Carnett sign on exam. Will await EGD. Prior US showed no gallstones.  Helper Cellar, MD [facility] Pager [phone]  CC: Aura Dials, MD

## 2017-11-17 NOTE — Patient Instructions (Addendum)
If you are age 34 or older, your body mass index should be between 23-30. Your Body mass index is 28.12 kg/m. If this is out of the aforementioned range listed, please consider follow up with your Primary Care Provider.  If you are age 46 or younger, your body mass index should be between 19-25. Your Body mass index is 28.12 kg/m. If this is out of the aformentioned range listed, please consider follow up with your Primary Care Provider.   You have been scheduled for an endoscopy. Please follow written instructions given to you at your visit today. If you use inhalers (even only as needed), please bring them with you on the day of your procedure. Your physician has requested that you go to www.startemmi.com and enter the access code given to you at your visit today. This web site gives a general overview about your procedure. However, you should still follow specific instructions given to you by our office regarding your preparation for the procedure.  We have sent a refill for your Nexium to your pharmacy.  Thank you for entrusting me with your care, Dr. Oneida Castle Cellar

## 2017-12-07 ENCOUNTER — Encounter: Payer: BLUE CROSS/BLUE SHIELD | Admitting: Gastroenterology

## 2017-12-09 ENCOUNTER — Other Ambulatory Visit: Payer: Self-pay

## 2017-12-09 ENCOUNTER — Ambulatory Visit (AMBULATORY_SURGERY_CENTER): Payer: BLUE CROSS/BLUE SHIELD | Admitting: Gastroenterology

## 2017-12-09 ENCOUNTER — Encounter: Payer: Self-pay | Admitting: Gastroenterology

## 2017-12-09 VITALS — BP 114/76 | HR 76 | Temp 98.7°F | Resp 18 | Ht 60.0 in | Wt 147.0 lb

## 2017-12-09 DIAGNOSIS — R1310 Dysphagia, unspecified: Secondary | ICD-10-CM

## 2017-12-09 DIAGNOSIS — K222 Esophageal obstruction: Secondary | ICD-10-CM | POA: Diagnosis not present

## 2017-12-09 DIAGNOSIS — K219 Gastro-esophageal reflux disease without esophagitis: Secondary | ICD-10-CM

## 2017-12-09 MED ORDER — SODIUM CHLORIDE 0.9 % IV SOLN: 500.0000 mL | Freq: Once | INTRAVENOUS | Status: DC

## 2017-12-09 NOTE — Progress Notes (Signed)
Called to room to assist during endoscopic procedure.  Patient ID and intended procedure confirmed with present staff. Received instructions for my participation in the procedure from the performing physician.  

## 2017-12-09 NOTE — Progress Notes (Signed)
Pt's states no medical or surgical changes since previsit or office visit. 

## 2017-12-09 NOTE — Patient Instructions (Signed)
**  Handouts given on Dilation diet and Hiatal Hernia**   YOU HAD AN ENDOSCOPIC PROCEDURE TODAY: Refer to the procedure report and other information in the discharge instructions given to you for any specific questions about what was found during the examination. If this information does not answer your questions, please call [facility] office at [phone] to clarify.   YOU SHOULD EXPECT: Some feelings of bloating in the abdomen. Passage of more gas than usual. Walking can help get rid of the air that was put into your GI tract during the procedure and reduce the bloating. If you had a lower endoscopy (such as a colonoscopy or flexible sigmoidoscopy) you may notice spotting of blood in your stool or on the toilet paper. Some abdominal soreness may be present for a day or two, also.  DIET: Your first meal following the procedure should be a light meal and then it is ok to progress to your normal diet. A half-sandwich or bowl of soup is an example of a good first meal. Heavy or fried foods are harder to digest and may make you feel nauseous or bloated. Drink plenty of fluids but you should avoid alcoholic beverages for 24 hours. If you had a esophageal dilation, please see attached instructions for diet.    ACTIVITY: Your care partner should take you home directly after the procedure. You should plan to take it easy, moving slowly for the rest of the day. You can resume normal activity the day after the procedure however YOU SHOULD NOT DRIVE, use power tools, machinery or perform tasks that involve climbing or major physical exertion for 24 hours (because of the sedation medicines used during the test).   SYMPTOMS TO REPORT IMMEDIATELY: A gastroenterologist can be reached at any hour. Please call [phone]  for any of the following symptoms:   Following upper endoscopy (EGD, EUS, ERCP, esophageal dilation) Vomiting of blood or coffee ground material  New, significant abdominal pain  New, significant  chest pain or pain under the shoulder blades  Painful or persistently difficult swallowing  New shortness of breath  Black, tarry-looking or red, bloody stools  FOLLOW UP:  If any biopsies were taken you will be contacted by phone or by letter within the next 1-3 weeks. Call [phone]  if you have not heard about the biopsies in 3 weeks.  Please also call with any specific questions about appointments or follow up tests.

## 2017-12-09 NOTE — Op Note (Signed)
[facility] Patient Name: Delina Kruczek Procedure Date: 12/09/2017 11:35 AM MRN: [id number] Endoscopist: Remo Lipps P. Armbruster MD, MD Age: 32 Referring MD:  Date of Birth: [birth year] Gender: Female Account #: [id number] Procedure:                Upper GI endoscopy Indications:              Epigastric abdominal pain - history of H pylori,                            Dysphagia - barium swallow with mild stricture at                            GEJ, Heartburn Medicines:                Monitored Anesthesia Care Procedure:                Pre-Anesthesia Assessment:                           - Prior to the procedure, a History and Physical                            was performed, and patient medications and                            allergies were reviewed. The patient's tolerance of                            previous anesthesia was also reviewed. The risks                            and benefits of the procedure and the sedation                            options and risks were discussed with the patient.                            All questions were answered, and informed consent                            was obtained. Prior Anticoagulants: The patient has                            taken no previous anticoagulant or antiplatelet                            agents. ASA Grade Assessment: II - A patient with                            mild systemic disease. After reviewing the risks                            and benefits, the patient was deemed in  satisfactory condition to undergo the procedure.                           After obtaining informed consent, the endoscope was                            passed under direct vision. Throughout the                            procedure, the patient's blood pressure, pulse, and                            oxygen saturations were monitored continuously. The                            Endoscope was introduced through the  mouth, and                            advanced to the second part of duodenum. The upper                            GI endoscopy was accomplished without difficulty.                            The patient tolerated the procedure well. Scope In: Scope Out: Findings:                 Esophagogastric landmarks were identified: the                            Z-line was found at 35 cm, the gastroesophageal                            junction was found at 35 cm and the upper extent of                            the gastric folds was found at 37 cm from the                            incisors.                           A 2 cm hiatal hernia was present.                           One very subtle benign-appearing, intrinsic                            stenosis was found at the GEJ. A TTS dilator was                            passed through the scope. Dilation with an 18-19-20  mm balloon dilator was performed to 18 mm, 19 mm                            and 20 mm. No mucosal wrents were noted after                            dilation to 2cm.                           The exam of the esophagus was otherwise normal.                           Biopsies were taken with a cold forceps in the                            upper third of the esophagus, in the middle third                            of the esophagus and in the lower third of the                            esophagus for histology to rule out eosinophilic                            esophagitis.                           The entire examined stomach was normal. Biopsies                            were taken from the antrum, body, and incisura with                            a cold forceps for Helicobacter pylori testing.                           The duodenal bulb and second portion of the                            duodenum were normal. Complications:            No immediate complications. Estimated blood loss:                             Minimal. Estimated Blood Loss:     Estimated blood loss was minimal. Impression:               - Esophagogastric landmarks identified.                           - 2 cm hiatal hernia.                           - Very subtle benign-appearing esophageal stenosis.  Dilated to 78mm without appreciable mucosal wrent.                           - Normal esophagus otherwise - biopsies taken to                            rule out EoE                           - Normal stomach. Biopsied.                           - Normal duodenal bulb and second portion of the                            duodenum. Recommendation:           - Patient has a contact number available for                            emergencies. The signs and symptoms of potential                            delayed complications were discussed with the                            patient. Return to normal activities tomorrow.                            Written discharge instructions were provided to the                            patient.                           - Resume previous diet.                           - Continue present medications.                           - Await pathology results and course following                            dilation Steven P. Armbruster MD, MD 12/09/2017 11:57:36 AM This report has been signed electronically.

## 2017-12-10 ENCOUNTER — Telehealth: Payer: Self-pay | Admitting: *Deleted

## 2017-12-10 ENCOUNTER — Telehealth: Payer: Self-pay

## 2017-12-10 NOTE — Telephone Encounter (Signed)
  Follow up Call-  Call back number 12/09/2017  Post procedure Call Back phone  # [phone]  Permission to leave phone message Yes  Some recent data might be hidden    2nd attempt to reach pt; [facility]

## 2017-12-10 NOTE — Telephone Encounter (Signed)
Left message

## 2018-01-01 ENCOUNTER — Telehealth: Payer: Self-pay | Admitting: Gastroenterology

## 2018-01-01 NOTE — Telephone Encounter (Signed)
Patient wants to know if she needs to continue to take the medication Dr.Armbruster prescribed before her procedure on 1.9.19. Pt also states she is still having problems and wants to know what to do before appt on 2.28.19

## 2018-01-01 NOTE — Telephone Encounter (Signed)
Left message to call back  

## 2018-01-01 NOTE — Telephone Encounter (Signed)
Spoke to patient and she had not been consistently taking her Nexium BID. Instructed to take it as prescribed, and keep follow up visit on 2/28. Discussed GERD precautions, patient will try to follow them closer. Will contact office if worsening symptoms.

## 2018-01-20 ENCOUNTER — Encounter: Payer: Self-pay | Admitting: Gastroenterology

## 2018-01-20 ENCOUNTER — Other Ambulatory Visit (INDEPENDENT_AMBULATORY_CARE_PROVIDER_SITE_OTHER): Payer: BLUE CROSS/BLUE SHIELD

## 2018-01-20 ENCOUNTER — Ambulatory Visit: Payer: BLUE CROSS/BLUE SHIELD | Admitting: Gastroenterology

## 2018-01-20 VITALS — BP 104/70 | HR 60 | Ht 61.0 in | Wt 145.0 lb

## 2018-01-20 DIAGNOSIS — R1013 Epigastric pain: Secondary | ICD-10-CM

## 2018-01-20 DIAGNOSIS — K31A0 Gastric intestinal metaplasia, unspecified: Secondary | ICD-10-CM

## 2018-01-20 DIAGNOSIS — K5909 Other constipation: Secondary | ICD-10-CM | POA: Diagnosis not present

## 2018-01-20 DIAGNOSIS — R1310 Dysphagia, unspecified: Secondary | ICD-10-CM

## 2018-01-20 DIAGNOSIS — K219 Gastro-esophageal reflux disease without esophagitis: Secondary | ICD-10-CM | POA: Diagnosis not present

## 2018-01-20 DIAGNOSIS — K3189 Other diseases of stomach and duodenum: Secondary | ICD-10-CM

## 2018-01-20 LAB — CBC WITH DIFFERENTIAL/PLATELET
BASOS PCT: 0.6 % (ref 0.0–3.0)
Basophils Absolute: 0.1 10*3/uL (ref 0.0–0.1)
EOS ABS: 0.1 10*3/uL (ref 0.0–0.7)
EOS PCT: 0.6 % (ref 0.0–5.0)
HCT: 38 % (ref 36.0–46.0)
Hemoglobin: 12.5 g/dL (ref 12.0–15.0)
LYMPHS PCT: 30.9 % (ref 12.0–46.0)
Lymphs Abs: 2.8 10*3/uL (ref 0.7–4.0)
MCHC: 33 g/dL (ref 30.0–36.0)
MCV: 72.7 fl — ABNORMAL LOW (ref 78.0–100.0)
Monocytes Absolute: 0.6 10*3/uL (ref 0.1–1.0)
Monocytes Relative: 6.1 % (ref 3.0–12.0)
Neutro Abs: 5.6 10*3/uL (ref 1.4–7.7)
Neutrophils Relative %: 61.8 % (ref 43.0–77.0)
Platelets: 351 10*3/uL (ref 150.0–400.0)
RBC: 5.22 Mil/uL — ABNORMAL HIGH (ref 3.87–5.11)
RDW: 14.8 % (ref 11.5–15.5)
WBC: 9.1 10*3/uL (ref 4.0–10.5)

## 2018-01-20 LAB — COMPREHENSIVE METABOLIC PANEL
ALBUMIN: 4.2 g/dL (ref 3.5–5.2)
ALT: 13 U/L (ref 0–35)
AST: 14 U/L (ref 0–37)
Alkaline Phosphatase: 33 U/L — ABNORMAL LOW (ref 39–117)
BUN: 10 mg/dL (ref 6–23)
CALCIUM: 9.2 mg/dL (ref 8.4–10.5)
CO2: 27 mEq/L (ref 19–32)
CREATININE: 0.85 mg/dL (ref 0.40–1.20)
Chloride: 107 mEq/L (ref 96–112)
GFR: 81.64 mL/min (ref >60.00)
Glucose, Bld: 87 mg/dL (ref 70–99)
POTASSIUM: 3.4 meq/L — AB (ref 3.5–5.1)
Sodium: 140 mEq/L (ref 135–145)
Total Bilirubin: 0.3 mg/dL (ref 0.2–1.2)
Total Protein: 7.7 g/dL (ref 6.0–8.3)

## 2018-01-20 LAB — LIPASE: Lipase: 24 U/L (ref 11.0–59.0)

## 2018-01-20 LAB — TSH: TSH: 1.77 u[IU]/mL (ref 0.35–4.50)

## 2018-01-20 MED ORDER — LINACLOTIDE 145 MCG PO CAPS: 145.0000 ug | ORAL_CAPSULE | Freq: Every day | ORAL | 0 refills | Status: DC

## 2018-01-20 MED ORDER — BUSPIRONE HCL 7.5 MG PO TABS: ORAL_TABLET | ORAL | 1 refills | Status: DC

## 2018-01-20 MED ORDER — SUCRALFATE 1 GM/10ML PO SUSP: 1.0000 g | Freq: Four times a day (QID) | ORAL | 0 refills | Status: DC | PRN

## 2018-01-20 NOTE — Patient Instructions (Addendum)
If you are age 34 or older, your body mass index should be between 23-30. Your Body mass index is 27.4 kg/m. If this is out of the aforementioned range listed, please consider follow up with your Primary Care Provider.  If you are age 30 or younger, your body mass index should be between 19-25. Your Body mass index is 27.4 kg/m. If this is out of the aformentioned range listed, please consider follow up with your Primary Care Provider.   Please go to the lab in the basement of our building to have lab work done as you leave today.  We have sent the following medications to your pharmacy for you to pick up at your convenience: Buspirone   We have given you samples of the following medication to take: Carafate: take every 6 hours as needed Linzess, 156mcg : take once a day  Please call us in 2 weeks and let us know how you are doing.  If the Linzess is helping we can write you a prescription for more.  Thank you for entrusting me with your care and for choosing [facility], Dr. Lafayette Cellar

## 2018-01-20 NOTE — Progress Notes (Signed)
HPI :  32 year old female here for a follow-up visit to discuss multiple issues.   She is known to me for history of dysphasia.  This has been an ongoing issue for over a year.  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  She had an EGD on January 9 showing a suspected mild GEJ stenosis which was dilated to 20 mm after which no mucosal rents were seen.  Biopsies were taken which showed no evidence of EoE.    She has had a prior history of H. pylori status post treatment, biopsies stomach taken at that time showed no evidence of H. pylori however positive for gastric intestinal metaplasia.  She is here to discuss these results today.  She reports her dysphasia has mostly persisted, she does not think she had much benefit from the dilation.  She states she continues to have some dysphagia to solids and rarely liquids at times.  She states she continues to have some occasional pyrosis and regurgitation despite taking Nexium 40 mg twice daily.  The symptoms in general seem mild, her main complaint is ongoing postprandial epigastric discomfort.  This has been long-standing and led to initial workup previously for H. pylori for which she was treated in the past.  She had an an ultrasound of her abdomen done in 2014 showing no evidence of gallstones.  He continues to feel occasional discomfort in her epigastric area, usually after eating.  Occasionally can radiate to the right upper quadrant however mostly centers in the epigastric area.  She denies any family history of gastric cancer.  She is from [place] originally.  She denies any tobacco use.  She otherwise endorses chronic constipation which is been ongoing for quite a period of time.  She has infrequent bowel movements as hard stools if she does not take anything.  She states is been going on for years and has tried multiple regimens including MiraLAX in the past.  She denies any blood in the stools.  She  otherwise states she is feeling stressed at times.  She is tearful during the clinic visit today.  She denies feeling depressed but can feel anxious.   EGD 12/09/2017 - 2cm HH, mild GEJ stenosis, dilated to 66mm without any mucosal wrents, biopsies taken to rule out EoE and H pylori - no evidence of EoE or H pylori however there is presence of gastric intestinal metaplasia Esophogram 06/23/17 - distal esophageal stricture, delayed tablet - low grade peptic stricture US abdomen - 06/21/13 - normal    Past Medical History:  Diagnosis Date  . Allergic rhinitis, cause unspecified   . Esophageal reflux   . Gastritis   . Secondhand smoke exposure      Past Surgical History:  Procedure Laterality Date  . BREATH TEK H PYLORI N/A 12/28/2013   Procedure: BREATH TEK H PYLORI;  Surgeon: Sable Feil, MD;  Location: [facility];  Service: Endoscopy;  Laterality: N/A;  . TOOTH EXTRACTION     Family History  Problem Relation Age of Onset  . Migraines Mother   . Hypertension Mother   . Hearing loss Father   . Migraines Father   . Hypertension Father   . Hearing loss Brother    Social History   Tobacco Use  . Smoking status: Never Smoker  . Smokeless tobacco: Never Used  Substance Use Topics  . Alcohol use: Yes    Comment: Social   . Drug use: No  Current Outpatient Medications  Medication Sig Dispense Refill  . esomeprazole (NEXIUM) 40 MG capsule Take 1 capsule (40 mg total) by mouth 2 (two) times daily before a meal. 180 capsule 1  . MedroxyPROGESTERone Acetate 150 MG/ML SUSY INJECT 1 ML Q 3 MONTHS  3  . busPIRone (BUSPAR) 7.5 MG tablet Take 1 tablet by mouth twice a day for 2 weeks,  then 15mg  (2 tablets) twice a day thereafter 60 tablet 1  . fluticasone (FLONASE) 50 MCG/ACT nasal spray Place 2 sprays into both nostrils daily.    Marland Kitchen guaiFENesin (MUCINEX) 600 MG 12 hr tablet Take 1,200 mg by mouth once as needed for congestion.    Marland Kitchen linaclotide (LINZESS) 145 MCG CAPS capsule Take  1 capsule (145 mcg total) by mouth daily before breakfast. 12 capsule 0  . montelukast (SINGULAIR) 10 MG tablet Take 10 mg by mouth at bedtime.    . sucralfate (CARAFATE) 1 GM/10ML suspension Take 10 mLs (1 g total) by mouth every 6 (six) hours as needed. 60 mL 0   No current facility-administered medications for this visit.    No Known Allergies   Review of Systems: All systems reviewed and negative except where noted in HPI.   No recent labvs  Physical Exam: BP 104/70   Pulse 60   Ht 5\' 1"  (1.549 m)   Wt 145 lb (65.8 kg)   BMI 27.40 kg/m  Constitutional: Pleasant,well-developed, female in no acute distress. HEENT: Normocephalic and atraumatic. Conjunctivae are normal. No scleral icterus. Neck supple.  Cardiovascular: Normal rate, regular rhythm.  Pulmonary/chest: Effort normal and breath sounds normal. No wheezing, rales or rhonchi. Abdominal: Soft, nondistended, mild epigastric TTP without rebound or guarding.  There are no masses palpable. No hepatomegaly. Extremities: no edema Lymphadenopathy: No cervical adenopathy noted. Neurological: Alert and oriented to person place and time. Skin: Skin is warm and dry. No rashes noted. Psychiatric: Normal mood and affect. Behavior is normal.   ASSESSMENT AND PLAN: 32 year old female with history as outlined above, here to discuss the following issues:  Dyspepsia / Epigastric discomfort - this is her main concern, especially in relation to recent biopsies of gastric intestinal metaplasia, which we discussed as below.  She has previously tested negative for gallstones, EGD showed no cause for her pain.  I reassured her she does not have gastric cancer. She is quite tearful today and anxious, suspect she could have functional dyspepsia given the chronicity of some of the symptoms and her course.  Will get basic labs to include CBC, CMET, lipase to ensure normal as it has been a few years since she has had basic blood work.  I discussed  dyspepsia otherwise with her and potential treatment options.  Recommend a trial of buspirone 7.5 mg twice daily initially, can titrate up to 15 mg twice a day over the next few weeks.  If her symptoms persist despite this regimen we can consider a CT scan of the abdomen and pelvis.  I asked her to contact me in a few weeks to see how she is doing, we will otherwise await results of her blood work.  Dysphasia / GERD -she had a 20 mm balloon dilation of the distal esophagus without any clear benefit, thus this low-grade stricture noted on barium swallow may not be causing her symptoms of dysphagia.  I discussed esophageal manometry with her to exclude a motility disorder, symptoms are mild at this point and she wants to hold off on this.  If she does  do this I would also recommend a 24-hour pH impedance study on Nexium twice a day given her reported reflux symptoms on this regimen -not sure if she is having nonacid reflux versus functional heartburn etc. She wishes to hold off on this for now but if symptoms persist that would be the recommended next step.  Otherwise give her a trial of liquid Carafate to see if this helps at all.  She agreed.   Chronic constipation -ongoing issues with chronic constipation, no alarm symptoms.  She asked to recheck her TSH which I will do for her.  Otherwise discussed options with her, we will try Linzess 145 mcg/day, sample for 12 days given. She can touch base with [place] in the upcoming days to let us know his dose works for her.  Gastric intestinal metaplasia - noted incidentally on random biopsies to rule out H. Pylori.  I discussed what this is with her and potential increased risk for gastric cancer.  I reassured her there is no evidence of gastric cancer on her recent EGD, and overall risk for gastric cancer is very low.  In [place] there are no clear guidelines on surveillance for this, however in this patient with an Asian ethnicity I offered her surveillance  endoscopy with mapping as she may be at higher risk for gastric cancer.  We may consider an EGD within a year or so for mapping.  She agreed with this plan.  Monmouth Cellar, MD [facility] Pager [phone]

## 2018-01-21 ENCOUNTER — Other Ambulatory Visit: Payer: Self-pay

## 2018-01-21 DIAGNOSIS — K219 Gastro-esophageal reflux disease without esophagitis: Secondary | ICD-10-CM

## 2018-01-21 DIAGNOSIS — R1310 Dysphagia, unspecified: Secondary | ICD-10-CM

## 2018-01-21 NOTE — Progress Notes (Signed)
Labs: TIBC and Ferritin

## 2018-01-25 ENCOUNTER — Other Ambulatory Visit (INDEPENDENT_AMBULATORY_CARE_PROVIDER_SITE_OTHER): Payer: BLUE CROSS/BLUE SHIELD

## 2018-01-25 DIAGNOSIS — R1310 Dysphagia, unspecified: Secondary | ICD-10-CM

## 2018-01-25 DIAGNOSIS — K219 Gastro-esophageal reflux disease without esophagitis: Secondary | ICD-10-CM

## 2018-01-25 LAB — FERRITIN: FERRITIN: 192.4 ng/mL (ref 10.0–291.0)

## 2018-01-25 NOTE — Addendum Note (Signed)
Addended by: Kathlyn Sacramento on: 01/25/2018 09:21 AM   Modules accepted: Orders

## 2018-01-26 LAB — IRON AND TIBC
Iron Saturation: 47 % (ref 15–55)
Iron: 119 ug/dL (ref 27–159)
TIBC: 251 ug/dL (ref 250–450)
UIBC: 132 ug/dL (ref 131–425)

## 2018-01-28 ENCOUNTER — Ambulatory Visit: Payer: BLUE CROSS/BLUE SHIELD | Admitting: Gastroenterology

## 2018-02-01 ENCOUNTER — Telehealth: Payer: Self-pay | Admitting: *Deleted

## 2018-02-01 MED ORDER — LINACLOTIDE 72 MCG PO CAPS: 72.0000 ug | ORAL_CAPSULE | Freq: Every day | ORAL | 0 refills | Status: DC

## 2018-02-01 MED ORDER — SUCRALFATE 1 GM/10ML PO SUSP: 1.0000 g | Freq: Four times a day (QID) | ORAL | 1 refills | Status: DC | PRN

## 2018-02-01 NOTE — Telephone Encounter (Signed)
I spoke to patient earlier today to advise of Dr Doyne Keel recommendations. She verbalizes understanding and is willing to try the Linzess 72 mcg to see if she has better results with that. She has already picked up samples of this. I have also sent a new prescription of carafate to her pharmacy for her to take every 6 hours as needed. She verbalizes understanding of this as well. She states that she will call back in a couple weeks to give [place] an update on how the 72 mcg Linzess is working for her.

## 2018-02-01 NOTE — Telephone Encounter (Signed)
-----   Message from Yetta Flock, MD sent at 01/29/2018  5:30 PM EST ----- Thanks Dottie, If the Linzess 145mcg was too strong, we can offer her samples of the 51mcg to take daily.  If the carafate samples have helped her, we can write prescription for carafate liquid suspension 10cc po q 6 hrs PRN. #1 bottle RF1

## 2018-02-03 ENCOUNTER — Telehealth: Payer: Self-pay | Admitting: Gastroenterology

## 2018-02-03 NOTE — Telephone Encounter (Signed)
Patient states medication carafate is not covered by ins and wants to know if there is anything similar that her insurance will cover that could be sent in. See phone note from 3.4.19.

## 2018-02-03 NOTE — Telephone Encounter (Signed)
Called pt and LM to call back to discuss tablet Carafate.

## 2018-02-03 NOTE — Telephone Encounter (Signed)
Patient returned phone call. °

## 2018-02-04 MED ORDER — SUCRALFATE 1 G PO TABS: ORAL_TABLET | ORAL | 2 refills | Status: DC

## 2018-02-04 NOTE — Telephone Encounter (Signed)
Spoke to pt.  Instructed how to make slurry with Carafate tablets. She expressed understanding.  Sent Rx to pts pharmacy.

## 2018-02-10 IMAGING — RF DG ESOPHAGUS
10 series · 24 of 24 positions shown · non-contrast
Comparison: Chest radiograph 06/21/2013

CLINICAL DATA: Globus sensation. Heartburn. Dysphagia. Mid chest
pain.

EXAM:
ESOPHOGRAM / BARIUM SWALLOW / BARIUM TABLET STUDY
TECHNIQUE: Combined double contrast and single contrast examination performed
using effervescent crystals, thick barium liquid, and thin barium
liquid. The patient was observed with fluoroscopy swallowing a 13 mm
barium sulphate tablet.
FLUOROSCOPY TIME:  Fluoroscopy Time:  2 minutes, 0 seconds
Radiation Exposure Index (if provided by the fluoroscopic device):
17 mGy
Number of Acquired Spot Images: 0

[Series 1: cp_standard · 0.34mm/px · 3 of 38 frames shown (1 of 10)]
[frame 6/38]
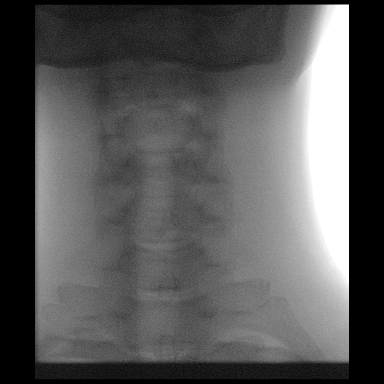
[frame 32/38]
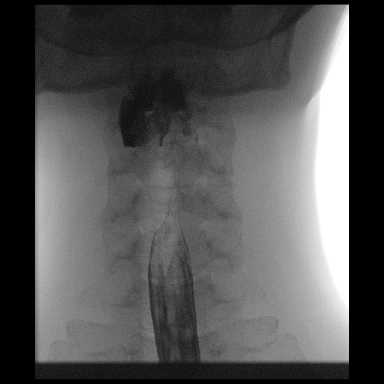
[frame 33/38]
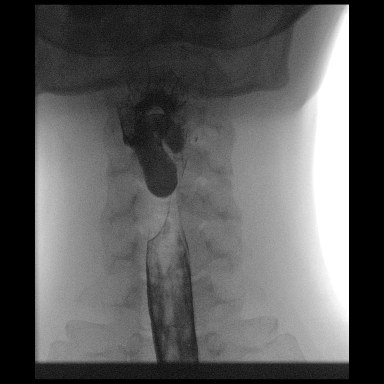

[Series 2: cp_standard · 0.34mm/px · 2 of 37 frames shown (2 of 10)]
[frame 19/37]
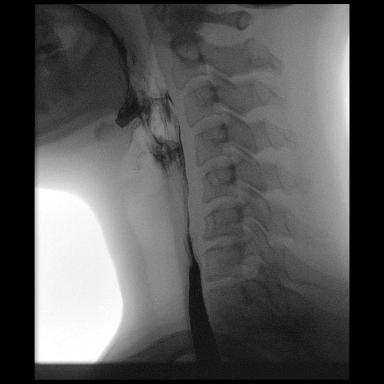
[frame 32/37]
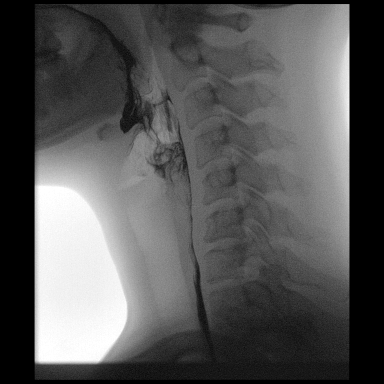

[Series 3: cp_standard · 0.34mm/px · 2 of 132 frames shown (3 of 10)]
[frame 8/132]
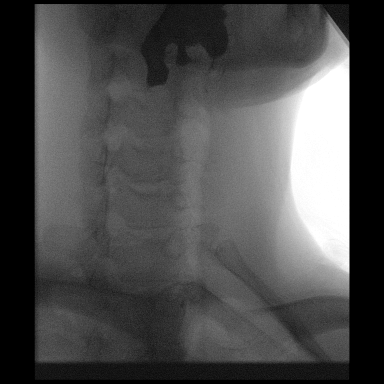
[frame 67/132]
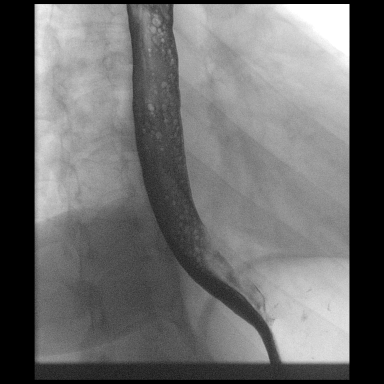

[Series 4: cp_standard · 0.36mm/px · 3 of 28 frames shown (4 of 10)]
[frame 5/28]
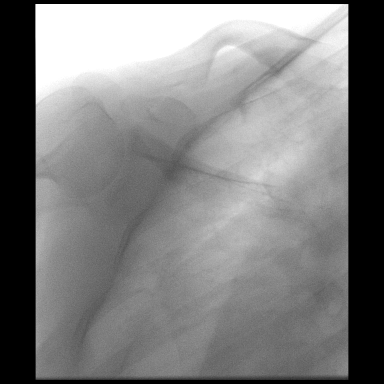
[frame 15/28]
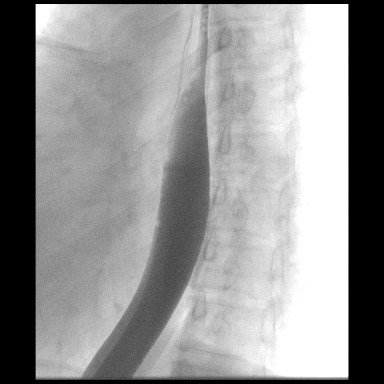
[frame 28/28]
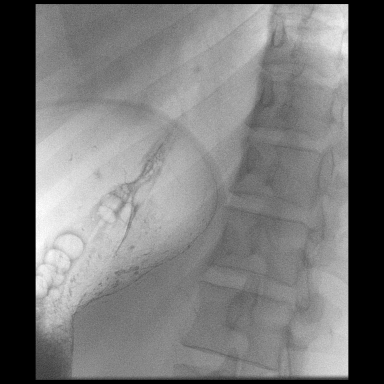

[Series 5: cp_standard · 0.36mm/px · 2 of 33 frames shown (5 of 10)]
[frame 17/33]
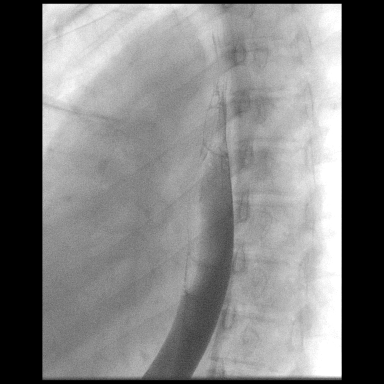
[frame 29/33]
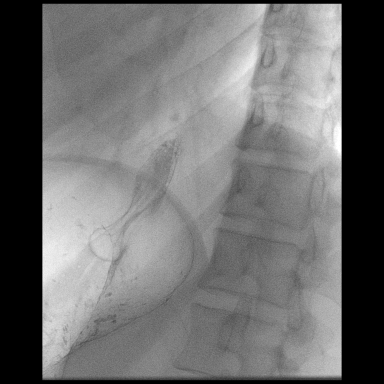

[Series 6: cp_standard · 0.36mm/px · 2 of 25 frames shown (6 of 10)]
[frame 4/25]
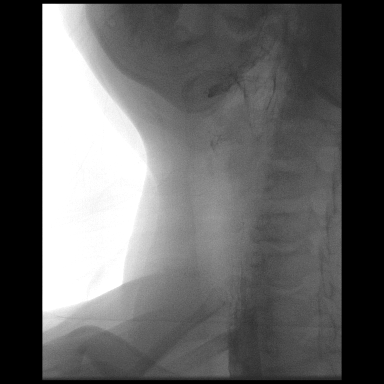
[frame 13/25]
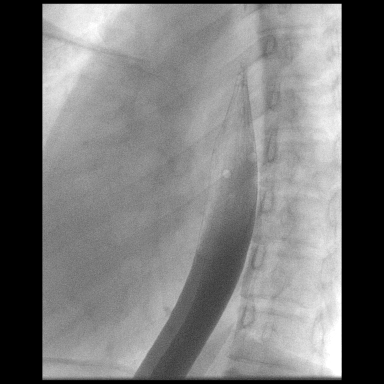

[Series 7: cp_standard · 0.36mm/px · 3 of 34 frames shown (7 of 10)]
[frame 6/34]
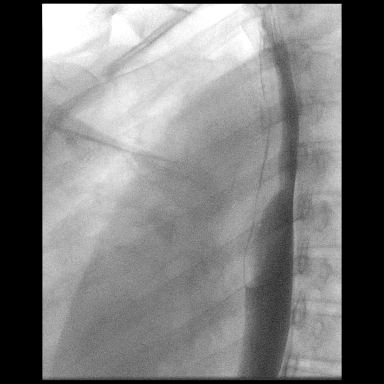
[frame 29/34]
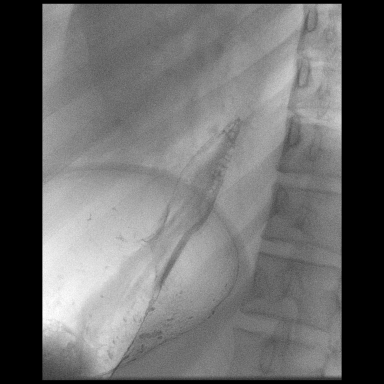
[frame 32/34]
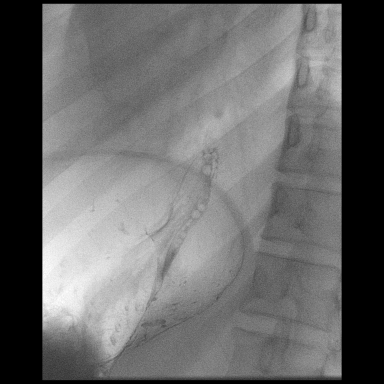

[Series 8: cp_standard · 0.35mm/px · 2 of 35 frames shown (8 of 10)]
[frame 18/35]
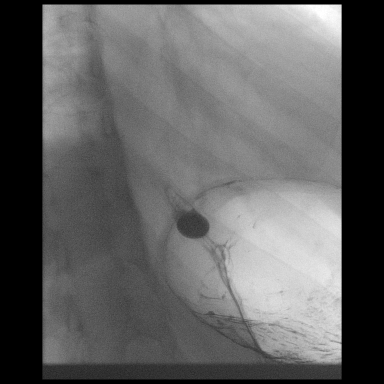
[frame 31/35]
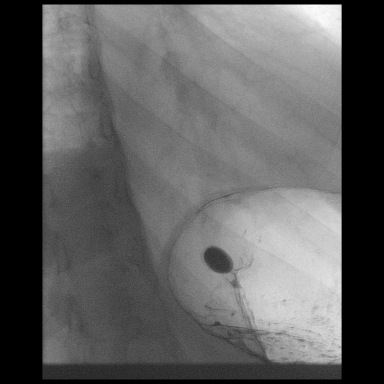

[Series 9: cp_standard · 0.35mm/px · 2 of 39 frames shown (9 of 10)]
[frame 6/39]
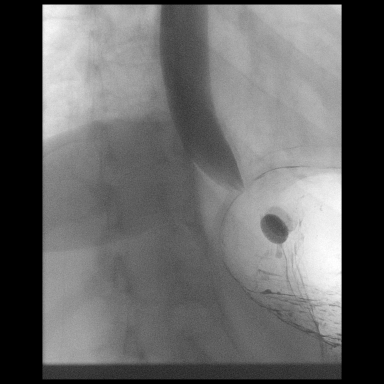
[frame 34/39]
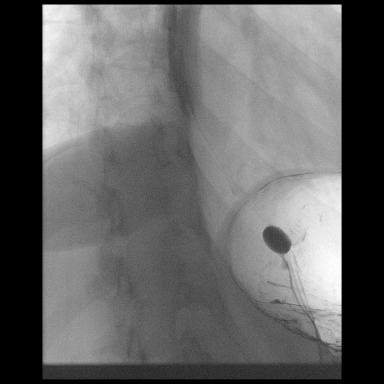

[Series 10: cp_standard · 0.35mm/px · 3 of 43 frames shown (10 of 10)]
[frame 7/43]
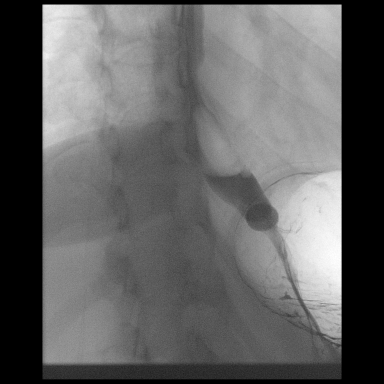
[frame 22/43]
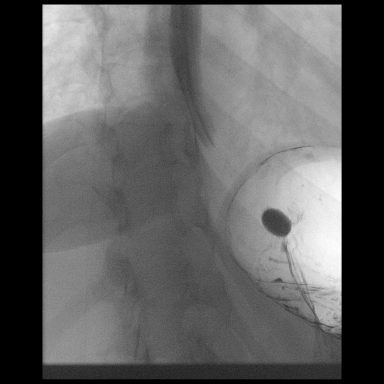
[frame 43/43]
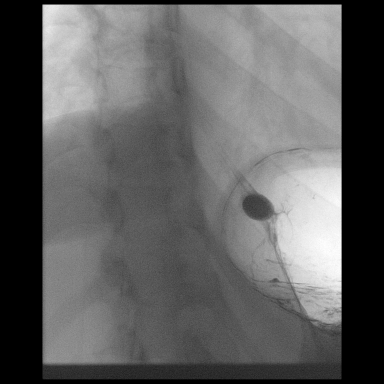

[24 of 24 positions shown; findings below may reference images not displayed]

FINDINGS: There is laryngeal penetration down nearly to the level of the cords
on swallowing, image [DATE]. No frank tracheal aspiration.

Primary peristaltic waves were normal on [DATE] swallows.

A 13 mm barium tablet impacted at the gastroesophageal junction.
There is smooth narrowing in this vicinity with maximum distended
diameter of 13 mm on the individual swallows.

No visible ulceration or mucosal irregularity. No hiatal hernia or
significant distal esophageal fold thickening.
IMPRESSION: 1. Mild smooth gradual narrowing of the distal esophagus adjacent to
the gastroesophageal junction, about 13 mm in diameter, causing
delay in passage of a 13 mm barium tablet. Narrowings of this size
can commonly contribute to symptoms of dysphagia. No well-defined
Schatzki ring or mucosal irregularity. This could represent a
low-grade peptic stricture.

## 2018-03-02 ENCOUNTER — Other Ambulatory Visit: Payer: Self-pay

## 2018-03-02 ENCOUNTER — Other Ambulatory Visit: Payer: Self-pay | Admitting: Gastroenterology

## 2018-03-02 MED ORDER — LINACLOTIDE 72 MCG PO CAPS: 72.0000 ug | ORAL_CAPSULE | Freq: Every day | ORAL | 3 refills | Status: DC

## 2018-03-02 NOTE — Progress Notes (Signed)
Sent script to pharmacy. 

## 2018-03-05 MED ORDER — LINACLOTIDE 72 MCG PO CAPS: 72.0000 ug | ORAL_CAPSULE | Freq: Every day | ORAL | 3 refills | Status: DC

## 2018-03-05 NOTE — Telephone Encounter (Signed)
Patient calling back regarding this. She is requesting rx be sent in.

## 2018-03-05 NOTE — Telephone Encounter (Signed)
Attempted to reach patient to advise that we sent linzess to her pharmacy. Unfortunately, mailbox is full.

## 2018-07-30 ENCOUNTER — Other Ambulatory Visit: Payer: Self-pay | Admitting: Gastroenterology

## 2018-10-19 ENCOUNTER — Other Ambulatory Visit: Payer: Self-pay | Admitting: Gastroenterology

## 2018-12-29 ENCOUNTER — Telehealth: Payer: Self-pay | Admitting: Gastroenterology

## 2018-12-29 MED ORDER — PLECANATIDE 3 MG PO TABS: 3.0000 mg | ORAL_TABLET | Freq: Every day | ORAL | 2 refills | Status: DC

## 2018-12-29 NOTE — Telephone Encounter (Signed)
If they would like her to try Trulance first, that is fine. 1 tab daily, will see if that helps. Thanks

## 2018-12-29 NOTE — Telephone Encounter (Signed)
New script for Trulance 3mg  sent to pharmacy.  Message left for pt.

## 2018-12-31 ENCOUNTER — Telehealth: Payer: Self-pay | Admitting: Gastroenterology

## 2018-12-31 NOTE — Telephone Encounter (Signed)
Pt insurance called [facility] and advised that medication authorization has been denied and the denial has been faxed over.

## 2018-12-31 NOTE — Telephone Encounter (Signed)
Denial for Linzess was received.  Pt aware. Script for Trulance was sent on 1-29. Called pharmacy and it has been approved with a $20 copay. Pt is aware it is ready for pick up.

## 2019-01-27 ENCOUNTER — Ambulatory Visit (INDEPENDENT_AMBULATORY_CARE_PROVIDER_SITE_OTHER): Payer: BLUE CROSS/BLUE SHIELD | Admitting: Family Medicine

## 2019-01-27 ENCOUNTER — Encounter: Payer: Self-pay | Admitting: Family Medicine

## 2019-01-27 VITALS — BP 104/66 | HR 126 | Temp 102.5°F | Ht 61.0 in | Wt 140.2 lb

## 2019-01-27 DIAGNOSIS — R6889 Other general symptoms and signs: Secondary | ICD-10-CM | POA: Diagnosis not present

## 2019-01-27 LAB — POCT INFLUENZA A/B
Influenza A, POC: NEGATIVE
Influenza B, POC: NEGATIVE

## 2019-01-27 LAB — POCT RAPID STREP A (OFFICE): Rapid Strep A Screen: NEGATIVE

## 2019-01-27 MED ORDER — IPRATROPIUM BROMIDE 0.06 % NA SOLN: 2.0000 | Freq: Four times a day (QID) | NASAL | 0 refills | Status: AC

## 2019-01-27 MED ORDER — OSELTAMIVIR PHOSPHATE 75 MG PO CAPS: 75.0000 mg | ORAL_CAPSULE | Freq: Two times a day (BID) | ORAL | 0 refills | Status: DC

## 2019-01-27 NOTE — Patient Instructions (Addendum)
Please start the Tamiflu and Atrovent.  Stay well-hydrated.  Continue taking Tylenol and/or Motrin as needed.  Let us know if your symptoms worsen or do not improve the next few days.    Take care, Dr Jerline Pain

## 2019-01-27 NOTE — Progress Notes (Signed)
   Chief Complaint:  Brenda Krause is a 33 y.o. female who presents for same day appointment with a chief complaint of fever.   Assessment/Plan:  Flu-like illness Rapid flu negative however given sick contact and clinical history, her pretest probability for influenza is very high.  No other obvious sources of infection on exam.  No signs of bacterial infection.  Will start Tamiflu.  Also start Atrovent for rhinorrhea/sinus congestion.  Encouraged good oral hydration.  Continue Tylenol/or Motrin as needed.  Discussed reasons to return to care and seek emergent care.  Follow-up as needed.     Subjective:  HPI:  Fever, acute problem Started a day ago. Worsened over that time. Associated with abdominal pain, chills, congestion, sore throat, and headache.  She has been in the ED a few times over the past couple of weeks.  No recent international travel.  Her father was recently diagnosed with flu a few days ago and she has been in close contact with him.  Her daughter has also been sick with URI symptoms. No other obvious alleviating or aggravating factors.   ROS: Per HPI  PMH: She reports that she has never smoked. She has never used smokeless tobacco. She reports current alcohol use. She reports that she does not use drugs.      Objective:  Physical Exam: BP 104/66 (BP Location: Left Arm, Patient Position: Sitting, Cuff Size: Normal)   Pulse (!) 126   Temp (!) 102.5 F (39.2 C) (Oral)   Ht 5\' 1"  (1.549 m)   Wt 140 lb 3.2 oz (63.6 kg)   SpO2 97%   BMI 26.49 kg/m   Gen: NAD, resting comfortably HEENT: TMs clear.  OP erythematous with no exudate.  Nasal mucosa erythematous and boggy bilaterally. CV: Regular rate and rhythm with no murmurs appreciated Pulm: Normal work of breathing, clear to auscultation bilaterally with no crackles, wheezes, or rhonchi  No results found for this or any previous visit (from the past 24 hour(s)).      Algis Greenhouse. Jerline Pain, MD 01/27/2019 1:12 PM

## 2019-04-18 ENCOUNTER — Other Ambulatory Visit: Payer: Self-pay

## 2019-04-18 ENCOUNTER — Encounter: Payer: Self-pay | Admitting: Gastroenterology

## 2019-04-18 ENCOUNTER — Ambulatory Visit (INDEPENDENT_AMBULATORY_CARE_PROVIDER_SITE_OTHER): Payer: Self-pay | Admitting: Gastroenterology

## 2019-04-18 DIAGNOSIS — K5909 Other constipation: Secondary | ICD-10-CM

## 2019-04-18 DIAGNOSIS — K219 Gastro-esophageal reflux disease without esophagitis: Secondary | ICD-10-CM

## 2019-04-18 DIAGNOSIS — R1310 Dysphagia, unspecified: Secondary | ICD-10-CM

## 2019-04-18 DIAGNOSIS — K3189 Other diseases of stomach and duodenum: Secondary | ICD-10-CM

## 2019-04-18 DIAGNOSIS — R1013 Epigastric pain: Secondary | ICD-10-CM

## 2019-04-18 DIAGNOSIS — K31A0 Gastric intestinal metaplasia, unspecified: Secondary | ICD-10-CM

## 2019-04-18 MED ORDER — BUSPIRONE HCL 7.5 MG PO TABS: ORAL_TABLET | ORAL | 1 refills | Status: DC

## 2019-04-18 MED ORDER — ESOMEPRAZOLE MAGNESIUM 40 MG PO CPDR: 40.0000 mg | DELAYED_RELEASE_CAPSULE | Freq: Two times a day (BID) | ORAL | 1 refills | Status: DC

## 2019-04-18 MED ORDER — PLECANATIDE 3 MG PO TABS: 3.0000 mg | ORAL_TABLET | Freq: Every day | ORAL | 2 refills | Status: DC

## 2019-04-18 NOTE — Patient Instructions (Addendum)
If you are age 35 or older, your body mass index should be between 23-30. Your There is no height or weight on file to calculate BMI. If this is out of the aforementioned range listed, please consider follow up with your Primary Care Provider.  If you are age 35 or younger, your body mass index should be between 19-25. Your There is no height or weight on file to calculate BMI. If this is out of the aformentioned range listed, please consider follow up with your Primary Care Provider.    We have sent the following medications to your pharmacy for you to pick up at your convenience: Trulace, Nexium, Buspirone  Start over the counter Miralax 1 capful twice daily as needed.   Thank you for choosing me and [facility].  Dr. Havery Moros

## 2019-04-18 NOTE — Progress Notes (Signed)
Virtual Visit via Telephone Note  I connected with Brenda Krause on 04/18/19 at  1:30 PM EDT by telephone and verified that I am speaking with the correct person using two identifiers.   I discussed the limitations, risks, security and privacy concerns of performing an evaluation and management service by telephone and the availability of in person appointments. I also discussed with the patient that there may be a patient responsible charge related to this service. The patient expressed understanding and agreed to proceed.  THIS ENCOUNTER IS A VIRTUAL VISIT DUE TO COVID-19 - PATIENT WAS NOT SEEN IN THE OFFICE. PATIENT HAS CONSENTED TO VIRTUAL VISIT / TELEMEDICINE VISIT USING [employer] APP   Location of patient: home Location of provider: office Persons participating: myself, patient   HPI :  33 y/o female here for a follow up visit. I last saw her in Feb 2019. She has been evaluated for dyspepsia, reflux, dysphagia in the past. She has had EGD, barium swallow, and US abdomen in the past for these issues.   Dyspepsia has been chronic. The symptoms in general seem mild, her main complaint is ongoing postprandial epigastric discomfort.  This has been long-standing and led to initial workup previously for H. pylori for which she was treated in the past.  She had an an ultrasound of her abdomen done in 2014 showing no evidence of gallstones.  He continues to feel occasional discomfort in her epigastric area, usually after eating. EGD did not show anything concerning. Biopsies did show GIM. She is from [place], no FH of known gastric cancer. She previously was on nexium for this and history of reflux. I started her on buspar at our last visit. She takes it PRN for "stress" but has not been using it routinely as previously discussed for her stomach.   She otherwise has has some globus and dysphagia if she eats quickly. No vomiting, minimal nausea. No weight loss. She has not been taking any nexium. She  does have pyrosis that bothers her at times.   For her constipation she had been taking Trulance PRN. Prior attempts at using Linzess were not sucessful, insurance did not cover it. She did not think low dose Miralax helped. Trulance helps but causes more urgent stools. She is taking it most days of the week. Stools are dark brown, no blood.   In general has been doing okay for the past year since I have seen her.   Prior workup: EGD 12/09/2017 - 2cm HH, mild GEJ stenosis, dilated to 91mm without any mucosal wrents, biopsies taken to rule out EoE and H pylori - no evidence of EoE or H pylori however there is presence of gastric intestinal metaplasia Esophogram 06/23/17 - distal esophageal stricture, delayed tablet - low grade peptic stricture US abdomen - 06/21/13 - normal    Past Medical History:  Diagnosis Date  . Allergic rhinitis, cause unspecified   . Dyspepsia   . Esophageal reflux   . Gastritis   . Secondhand smoke exposure      Past Surgical History:  Procedure Laterality Date  . BREATH TEK H PYLORI N/A 12/28/2013   Procedure: BREATH TEK H PYLORI;  Surgeon: Sable Feil, MD;  Location: [facility];  Service: Endoscopy;  Laterality: N/A;  . TOOTH EXTRACTION     Family History  Problem Relation Age of Onset  . Migraines Mother   . Hypertension Mother   . Hearing loss Father   . Migraines Father   . Hypertension  Father   . Hearing loss Brother    Social History   Tobacco Use  . Smoking status: Never Smoker  . Smokeless tobacco: Never Used  Substance Use Topics  . Alcohol use: Yes    Comment: Social   . Drug use: No   Current Outpatient Medications  Medication Sig Dispense Refill  . busPIRone (BUSPAR) 7.5 MG tablet Take 1 tablet by mouth twice a day for 2 weeks,  then 15mg  (2 tablets) twice a day thereafter 60 tablet 1  . esomeprazole (NEXIUM) 40 MG capsule Take 1 capsule (40 mg total) by mouth 2 (two) times daily before a meal. 180 capsule 1  . fluticasone  (FLONASE) 50 MCG/ACT nasal spray Place 2 sprays into both nostrils daily.    Marland Kitchen ipratropium (ATROVENT) 0.06 % nasal spray Place 2 sprays into both nostrils 4 (four) times daily. 15 mL 0  . MedroxyPROGESTERone Acetate 150 MG/ML SUSY INJECT 1 ML Q 3 MONTHS  3  . montelukast (SINGULAIR) 10 MG tablet Take 10 mg by mouth at bedtime.    Marland Kitchen oseltamivir (TAMIFLU) 75 MG capsule Take 1 capsule (75 mg total) by mouth 2 (two) times daily. 10 capsule 0  . Plecanatide (TRULANCE) 3 MG TABS Take 3 mg by mouth daily. 30 tablet 2   No current facility-administered medications for this visit.    No Known Allergies   Review of Systems: All systems reviewed and negative except where noted in HPI.   Lab Results  Component Value Date   WBC 9.1 01/20/2018   HGB 12.5 01/20/2018   HCT 38.0 01/20/2018   MCV 72.7 (L) 01/20/2018   PLT 351.0 01/20/2018    Lab Results  Component Value Date   CREATININE 0.85 01/20/2018   BUN 10 01/20/2018   NA 140 01/20/2018   K 3.4 (L) 01/20/2018   CL 107 01/20/2018   CO2 27 01/20/2018    Lab Results  Component Value Date   ALT 13 01/20/2018   AST 14 01/20/2018   ALKPHOS 33 (L) 01/20/2018   BILITOT 0.3 01/20/2018     Physical Exam: There were no vitals taken for this visit. NA   ASSESSMENT AND PLAN: 33 y/o female here for reassessment of the following:  Dyspepsia - chronic dyspepsia, normal labs, H pylori negative and EGD without pathology, prior RUQ US unremarkable. Overall doing okay since I have last seen her over a year ago, symptoms intermittent. She does endorse anxiety / stress which is associated to when she has symptoms. I gave her buspirone 7.5mg  BID and to titrate up as needed since our last visit. She took it PRN only when stressed but not routinely. I discussed that I suspect she more than likely has a functional bowel disorder/ dyspepsia and what this is. Recommend she use buspirone twice daily every day and titrate up as needed, I think this will  help her if she uses it routinely. We discussed the medication and she wanted to proceed. If after a good trial of this she does not have benefit and symptoms persist can consider a CT, but think this will be lower yield at this time. No alarm symptoms.  GERD / Dysphagia - refilled nexium. No concerning findings for mild dysphagia on prior EGD and barium study.   Chronic constipation - having some urgency with Trulance but it is working. Could try lower dose Linzess but her insurance did not cover it when last tried. Recommend she try Miralax twice daily at baseline and see  if that when used routinely will treat this, can use Trulance PRN if needed on top of that. She agreed.  Gastric intestinal metaplasia - discussed what this is with her, theoretical increased risk for gastric cancer. She is from [place], risk is higher than average [place]. Discussed guidelines on this and whether or not to perform surveillance. Would consider repeat EGD 3-5 years from her last exam with biopsies to evaluate extent and severity of GIM to clarify if she warrants longer term surveillance, given her ethnicity. She agreed.  I asked her to contact me in a few weeks and let me know how she is doing. If no improvement on the regimen will discuss options.   Berthoud Cellar, MD [facility]

## 2019-05-05 ENCOUNTER — Telehealth: Payer: Self-pay | Admitting: *Deleted

## 2019-05-05 ENCOUNTER — Ambulatory Visit (INDEPENDENT_AMBULATORY_CARE_PROVIDER_SITE_OTHER): Payer: BLUE CROSS/BLUE SHIELD | Admitting: Family Medicine

## 2019-05-05 ENCOUNTER — Ambulatory Visit: Payer: Self-pay

## 2019-05-05 ENCOUNTER — Telehealth: Payer: Self-pay

## 2019-05-05 VITALS — Temp 97.3°F | Ht 61.0 in | Wt 141.0 lb

## 2019-05-05 DIAGNOSIS — J029 Acute pharyngitis, unspecified: Secondary | ICD-10-CM

## 2019-05-05 DIAGNOSIS — Z20822 Contact with and (suspected) exposure to covid-19: Secondary | ICD-10-CM

## 2019-05-05 DIAGNOSIS — Z20828 Contact with and (suspected) exposure to other viral communicable diseases: Secondary | ICD-10-CM | POA: Diagnosis not present

## 2019-05-05 NOTE — Progress Notes (Signed)
    Chief Complaint:  Brenda Krause is a 33 y.o. female who presents today for a virtual office visit with a chief complaint of sore throat.   Assessment/Plan:  Sore throat/COVID-19 exposure No red flags.  She lives with several elderly family members.  Will send for COVID-19 testing.  Recommended supportive measures including Tylenol as needed.  Also recommended good oral hydration.  Discussed reasons to return to care and seek emergent care.    Subjective:  HPI:  Sore Throat  Started 3 days ago. Associated with headache.  Several of her household members have tested positive with COVID.  She has not had any fevers or chills.  No cough.  No shortness of breath.  Tried Tylenol which helps.  No other treatments tried.  No other obvious alleviating or aggravating factors.  ROS: Per HPI  PMH: She reports that she has never smoked. She has never used smokeless tobacco. She reports current alcohol use. She reports that she does not use drugs.      Objective/Observations  Physical Exam: Gen: NAD, resting comfortably Pulm: Normal work of breathing Neuro: Grossly normal, moves all extremities Psych: Normal affect and thought content  Virtual Visit via Video   I connected with Brenda Krause on 05/05/19 at  4:00 PM EDT by a video enabled telemedicine application and verified that I am speaking with the correct person using two identifiers. I discussed the limitations of evaluation and management by telemedicine and the availability of in person appointments. The patient expressed understanding and agreed to proceed.   Patient location: Home Provider location: [place] participating in the virtual visit: Myself and Patient     Algis Greenhouse. Jerline Pain, MD 05/05/2019 4:11 PM

## 2019-05-05 NOTE — Telephone Encounter (Signed)
Left message for pt to return call to be scheduled for COVID-19 testing.

## 2019-05-05 NOTE — Telephone Encounter (Signed)
Patient scheduled for today

## 2019-05-05 NOTE — Telephone Encounter (Signed)
Pt called and left message to return call to be schedule for COVID-19 testing. Testing requested by Andree Coss.

## 2019-05-05 NOTE — Telephone Encounter (Signed)
Patient called and says she was tested for Covid a week ago and was negative. She says since then she's been around her sister and some other family members who are all positive and they all live in the same house. She says she went to help her sister 3 days ago, wearing mask and gloves. She says she has a sore throat and a slight headache and is asking if she can be retested for covid. She denies SOB. I called the office and spoke to [place], [facility] who asked to speak to the patient, the call was connected successfully.   Answer Assessment - Initial Assessment Questions 1. CLOSE CONTACT: "Who is the person with the confirmed or suspected COVID-19 infection that you were exposed to?"     Family members positive for Covid  2. PLACE of CONTACT: "Where were you when you were exposed to COVID-19?" (e.g., home, school, medical waiting room; which city?)     Home 3. TYPE of CONTACT: "How much contact was there?" (e.g., sitting next to, live in same house, work in same office, same building)     Live in same house 4. DURATION of CONTACT: "How long were you in contact with the COVID-19 patient?" (e.g., a few seconds, passed by person, a few minutes, live with the patient)     Live with the patient 5. DATE of CONTACT: "When did you have contact with a COVID-19 patient?" (e.g., how many days ago)     3 days ago in room with sister 6. TRAVEL: "Have you traveled out of the country recently?" If so, "When and where?"     * Also ask about out-of-state travel, since the CDC has identified some high-risk cities for community spread in the [place].     * Note: Travel becomes less relevant if there is widespread community transmission where the patient lives.     No 7. COMMUNITY SPREAD: "Are there lots of cases of COVID-19 (community spread) where you live?" (See public health department website, if unsure)       No 8. SYMPTOMS: "Do you have any symptoms?" (e.g., fever, cough, breathing difficulty)     Sore throat,  headache a few days 9. PREGNANCY OR POSTPARTUM: "Is there any chance you are pregnant?" "When was your last menstrual period?" "Did you deliver in the last 2 weeks?"     No 10. HIGH RISK: "Do you have any heart or lung problems? Do you have a weak immune system?" (e.g., CHF, COPD, asthma, HIV positive, chemotherapy, renal failure, diabetes mellitus, sickle cell anemia)      No  Protocols used: CORONAVIRUS (COVID-19) EXPOSURE-A-AH

## 2019-05-05 NOTE — Telephone Encounter (Signed)
Pt needs to be tested for Covid-19/Coronavirus

## 2019-05-05 NOTE — Telephone Encounter (Signed)
Referred for covid19 testing due to possible exposure. By Dr. Dimas Chyle at [facility].Appointment made for 6/5 at the [facility] site 10:00am. Sware to wer mask and stay in vehicle.

## 2019-05-05 NOTE — Telephone Encounter (Signed)
Noted, forwarding to Dr. Jerline Pain as Juluis Rainier.

## 2019-05-06 ENCOUNTER — Other Ambulatory Visit: Payer: BLUE CROSS/BLUE SHIELD

## 2019-05-06 DIAGNOSIS — Z20822 Contact with and (suspected) exposure to covid-19: Secondary | ICD-10-CM

## 2019-05-09 LAB — NOVEL CORONAVIRUS, NAA: SARS-CoV-2, NAA: NOT DETECTED

## 2019-07-01 ENCOUNTER — Other Ambulatory Visit: Payer: Self-pay

## 2019-07-01 ENCOUNTER — Ambulatory Visit (INDEPENDENT_AMBULATORY_CARE_PROVIDER_SITE_OTHER): Payer: BLUE CROSS/BLUE SHIELD | Admitting: Physician Assistant

## 2019-07-01 ENCOUNTER — Encounter: Payer: Self-pay | Admitting: Physician Assistant

## 2019-07-01 DIAGNOSIS — Z20822 Contact with and (suspected) exposure to covid-19: Secondary | ICD-10-CM

## 2019-07-01 DIAGNOSIS — Z20828 Contact with and (suspected) exposure to other viral communicable diseases: Secondary | ICD-10-CM | POA: Diagnosis not present

## 2019-07-01 DIAGNOSIS — R110 Nausea: Secondary | ICD-10-CM

## 2019-07-01 NOTE — Progress Notes (Signed)
Virtual Visit via Video   I connected with Brenda Krause on 07/01/19 at  7:40 AM EDT by a video enabled telemedicine application and verified that I am speaking with the correct person using two identifiers. Location patient: Home Location provider: [facility], Office Persons participating in the virtual visit: [place], [place] PA-C.  I discussed the limitations of evaluation and management by telemedicine and the availability of in person appointments. The patient expressed understanding and agreed to proceed.  I acted as a Education administrator for [employer], [employer], LPN  Subjective:   HPI:  Nausea Pt c/o nausea x 5 days, no vomiting. C/o chills but no fever and body aches. Denies headache. Pt last moved her bowels 3 days ago and was normal per pt. When she eats she gets nausea. No diarrhea. She takes Trulance prn, and for the past few days hasn't taken it to see if she can have a BM without this. Denies blood in stool. Pain is generalized, mostly centered around her belly button, states that pain is 7/10 except today she has none.  She lives with her father and other family members who possibly have COVID-17 now. Several of her family members have already had COVID-20.    No LMP recorded. Patient has had an injection.   ROS: See pertinent positives and negatives per HPI.  Patient Active Problem List   Diagnosis Date Noted  . Dysphagia 10/06/2017  . GERD (gastroesophageal reflux disease) 06/29/2013  . HEADACHE 10/29/2010  . ALLERGIC RHINITIS 02/27/2010    Social History   Tobacco Use  . Smoking status: Never Smoker  . Smokeless tobacco: Never Used  Substance Use Topics  . Alcohol use: Yes    Comment: Social     Current Outpatient Medications:  .  acetaminophen (TYLENOL) 325 MG tablet, Take 650 mg by mouth every 6 (six) hours as needed., Disp: , Rfl:  .  busPIRone (BUSPAR) 7.5 MG tablet, Take 1 tablet by mouth twice a day for 2 weeks,  then 15mg  (2  tablets) twice a day thereafter (Patient taking differently: Take 15 mg by mouth 2 (two) times daily. Take 1 tablet by mouth twice a day for 2 weeks,  then 15mg  (2 tablets) twice a day thereafter), Disp: 60 tablet, Rfl: 1 .  esomeprazole (NEXIUM) 40 MG capsule, Take 1 capsule (40 mg total) by mouth 2 (two) times daily before a meal., Disp: 180 capsule, Rfl: 1 .  fluticasone (FLONASE) 50 MCG/ACT nasal spray, Place 2 sprays into both nostrils daily., Disp: , Rfl:  .  ipratropium (ATROVENT) 0.06 % nasal spray, Place 2 sprays into both nostrils 4 (four) times daily., Disp: 15 mL, Rfl: 0 .  montelukast (SINGULAIR) 10 MG tablet, Take 10 mg by mouth at bedtime., Disp: , Rfl:  .  Plecanatide (TRULANCE) 3 MG TABS, Take 3 mg by mouth daily., Disp: 30 tablet, Rfl: 2  No Known Allergies  Objective:   VITALS: Per patient if applicable, see vitals. GENERAL: Alert, appears well and in no acute distress. HEENT: Atraumatic, conjunctiva clear, no obvious abnormalities on inspection of external nose and ears. NECK: Normal movements of the head and neck. CARDIOPULMONARY: No increased WOB. Speaking in clear sentences. I:E ratio WNL.  MS: Moves all visible extremities without noticeable abnormality. PSYCH: Pleasant and cooperative, well-groomed. Speech normal rate and rhythm. Affect is appropriate. Insight and judgement are appropriate. Attention is focused, linear, and appropriate.  NEURO: CN grossly intact. Oriented as arrived to appointment  on time with no prompting. Moves both UE equally.  SKIN: No obvious lesions, wounds, erythema, or cyanosis noted on face or hands.  Assessment and Plan:   Emanii was seen today for nausea.  Diagnoses and all orders for this visit:  Close Exposure to Covid-19 Virus -     Novel Coronavirus, NAA ([facility])  Nausea   This is likely a viral infection, which can come from a number of respiratory viruses. We are going to send patient for drive-up testing. As a precaution, they  have been advised to remain home until COVID-19 results and then possible further quarantine after that based on results and symptoms. Advised if they experience a "second sickening" or worsening symptoms as the illness progresses, they are to call the office for further instructions or seek emergent evaluation for any severe symptoms.   **Also, we discussed differential diagnoses for her abd pain -- including constipation, early appendicitis, gastroenteritis, or other. Discussed that she should possibly resume trulance to see if this helps. Discussed worsening precautions that would warrant emergent evaluation, such as worsening pain, development of intense RLQ pain/fever/vomiting, inability to keep fluids down.  . Reviewed expectations re: course of current medical issues. . Discussed self-management of symptoms. . Outlined signs and symptoms indicating need for more acute intervention. . Patient verbalized understanding and all questions were answered. Marland Kitchen Health Maintenance issues including appropriate healthy diet, exercise, and smoking avoidance were discussed with patient. . See orders for this visit as documented in the electronic medical record.  I discussed the assessment and treatment plan with the patient. The patient was provided an opportunity to ask questions and all were answered. The patient agreed with the plan and demonstrated an understanding of the instructions.   The patient was advised to call back or seek an in-person evaluation if the symptoms worsen or if the condition fails to improve as anticipated.   CMA or LPN served as scribe during this visit. History, Physical, and Plan performed by medical provider. The above documentation has been reviewed and is accurate and complete.   [place], [place] 07/01/2019

## 2019-07-03 LAB — NOVEL CORONAVIRUS, NAA: SARS-CoV-2, NAA: NOT DETECTED

## 2019-08-09 ENCOUNTER — Ambulatory Visit: Payer: BLUE CROSS/BLUE SHIELD | Admitting: Family Medicine

## 2019-08-29 ENCOUNTER — Other Ambulatory Visit: Payer: Self-pay

## 2019-08-29 MED ORDER — BUSPIRONE HCL 7.5 MG PO TABS: 15.0000 mg | ORAL_TABLET | Freq: Two times a day (BID) | ORAL | 2 refills | Status: DC

## 2019-08-29 NOTE — Progress Notes (Signed)
rec'd fax refill request for Buspirone

## 2019-10-04 ENCOUNTER — Other Ambulatory Visit: Payer: Self-pay

## 2019-10-04 MED ORDER — TRULANCE 3 MG PO TABS: 3.0000 mg | ORAL_TABLET | Freq: Every day | ORAL | 2 refills | Status: DC

## 2019-10-04 NOTE — Progress Notes (Signed)
Fax request from [facility] for refill of Trulance.3mg  once daily

## 2020-01-09 ENCOUNTER — Other Ambulatory Visit: Payer: Self-pay

## 2020-01-09 MED ORDER — TRULANCE 3 MG PO TABS: 3.0000 mg | ORAL_TABLET | Freq: Every day | ORAL | 3 refills | Status: DC

## 2020-01-09 NOTE — Progress Notes (Signed)
Received fax request to refill Trulance.  Pt last seen 04-2019.  Sent refill.

## 2020-01-10 ENCOUNTER — Ambulatory Visit: Payer: BLUE CROSS/BLUE SHIELD | Attending: Internal Medicine

## 2020-01-10 ENCOUNTER — Ambulatory Visit (INDEPENDENT_AMBULATORY_CARE_PROVIDER_SITE_OTHER): Payer: BLUE CROSS/BLUE SHIELD | Admitting: Family Medicine

## 2020-01-10 ENCOUNTER — Encounter: Payer: Self-pay | Admitting: Family Medicine

## 2020-01-10 ENCOUNTER — Other Ambulatory Visit: Payer: Self-pay

## 2020-01-10 DIAGNOSIS — F419 Anxiety disorder, unspecified: Secondary | ICD-10-CM

## 2020-01-10 DIAGNOSIS — R5383 Other fatigue: Secondary | ICD-10-CM | POA: Diagnosis not present

## 2020-01-10 DIAGNOSIS — Z20822 Contact with and (suspected) exposure to covid-19: Secondary | ICD-10-CM

## 2020-01-10 NOTE — Progress Notes (Signed)
Brenda Krause is a 34 y.o. female who presents today for a virtual office visit.  Assessment/Plan:  New/Acute Problems: Fatigue Multifactorial.  Likely related to poor sleep.  Will check labs including CBC, C met, TSH, vitamin D, and vitamin B12.  We will also check Covid antibodies.  Chronic Problems Addressed Today: Anxiety Slightly worsened.  We will continue BuSpar 15 mg twice daily for now.  She does not want to change medication dosage at this point.     Subjective:  HPI:  Patient has had constellation of symptoms over the past month or so including fatigue, body aches, chills, shortness of breath, and sore throat.  No known sick contacts.  She has had symptoms nearly every day and symptoms seem to be worsening.  She is also having some abdominal bloating as well.  She does admit that her anxiety has been a little bit worse.  She has had some difficulty sleeping.  She has been told that she has had thyroid issues in the past.  She is taking BuSpar 15 mg twice daily to help with anxiety.  She would like to have blood work done.  She does not think that she has Covid.       Objective/Observations  Physical Exam: Gen: NAD, resting comfortably Pulm: Normal work of breathing Neuro: Grossly normal, moves all extremities Psych: Normal affect and thought content  Virtual Visit via Video   I connected with [name] on 01/10/20 at 10:40 AM EST by a video enabled telemedicine application and verified that I am speaking with the correct person using two identifiers. The limitations of evaluation and management by telemedicine and the availability of in person appointments were discussed. The patient expressed understanding and agreed to proceed.   Patient location: Home Provider location: [place] participating in the virtual visit: Myself and Patient     Algis Greenhouse. Jerline Pain, MD 01/10/2020 11:15 AM

## 2020-01-10 NOTE — Addendum Note (Signed)
Addended by: Francis Dowse T on: 01/10/2020 11:24 AM   Modules accepted: Orders

## 2020-01-10 NOTE — Assessment & Plan Note (Signed)
Slightly worsened.  We will continue BuSpar 15 mg twice daily for now.  She does not want to change medication dosage at this point.

## 2020-01-11 ENCOUNTER — Telehealth: Payer: Self-pay

## 2020-01-11 LAB — NOVEL CORONAVIRUS, NAA: SARS-CoV-2, NAA: NOT DETECTED

## 2020-01-11 NOTE — Telephone Encounter (Signed)
Patient have covid blood work questions. Patient had covid test yesterday results are negative. Please return pt call

## 2020-01-11 NOTE — Telephone Encounter (Signed)
Left voice message for patient to call clinic.  

## 2020-01-17 ENCOUNTER — Other Ambulatory Visit: Payer: Self-pay

## 2020-01-17 ENCOUNTER — Other Ambulatory Visit (INDEPENDENT_AMBULATORY_CARE_PROVIDER_SITE_OTHER): Payer: BLUE CROSS/BLUE SHIELD

## 2020-01-17 DIAGNOSIS — R5383 Other fatigue: Secondary | ICD-10-CM | POA: Diagnosis not present

## 2020-01-17 LAB — CBC
HCT: 38 % (ref 36.0–46.0)
Hemoglobin: 12.5 g/dL (ref 12.0–15.0)
MCHC: 33 g/dL (ref 30.0–36.0)
MCV: 73.3 fl — ABNORMAL LOW (ref 78.0–100.0)
Platelets: 388 10*3/uL (ref 150.0–400.0)
RBC: 5.18 Mil/uL — ABNORMAL HIGH (ref 3.87–5.11)
RDW: 15 % (ref 11.5–15.5)
WBC: 10.5 10*3/uL (ref 4.0–10.5)

## 2020-01-17 LAB — COMPREHENSIVE METABOLIC PANEL
ALT: 16 U/L (ref 0–35)
AST: 17 U/L (ref 0–37)
Albumin: 4.4 g/dL (ref 3.5–5.2)
Alkaline Phosphatase: 46 U/L (ref 39–117)
BUN: 17 mg/dL (ref 6–23)
CO2: 27 mEq/L (ref 19–32)
Calcium: 9.6 mg/dL (ref 8.4–10.5)
Chloride: 105 mEq/L (ref 96–112)
Creatinine, Ser: 0.78 mg/dL (ref 0.40–1.20)
GFR: 83.83 mL/min (ref >60.00)
Glucose, Bld: 89 mg/dL (ref 70–99)
Potassium: 4 mEq/L (ref 3.5–5.1)
Sodium: 139 mEq/L (ref 135–145)
Total Bilirubin: 0.4 mg/dL (ref 0.2–1.2)
Total Protein: 7.6 g/dL (ref 6.0–8.3)

## 2020-01-17 LAB — IBC + FERRITIN
Ferritin: 128 ng/mL (ref 10.0–291.0)
Iron: 131 ug/dL (ref 42–145)
Saturation Ratios: 48.5 % (ref 20.0–50.0)
Transferrin: 193 mg/dL — ABNORMAL LOW (ref 212.0–360.0)

## 2020-01-17 LAB — VITAMIN D 25 HYDROXY (VIT D DEFICIENCY, FRACTURES): VITD: 25.82 ng/mL — ABNORMAL LOW (ref 30.00–100.00)

## 2020-01-17 LAB — VITAMIN B12: Vitamin B-12: 557 pg/mL (ref 211–911)

## 2020-01-17 LAB — SARS-COV-2 IGG: SARS-COV-2 IgG: 0.02

## 2020-01-17 LAB — TSH: TSH: 1.66 u[IU]/mL (ref 0.35–4.50)

## 2020-01-17 NOTE — Progress Notes (Signed)
Please inform patient of the following:  Vitamin D is low but everything else is NORMAL. Recommend she start vitamin D 2000IU daily and we can recheckin 3-6 months.  Algis Greenhouse. Jerline Pain, MD 01/17/2020 12:54 PM

## 2020-01-20 ENCOUNTER — Other Ambulatory Visit: Payer: Self-pay

## 2020-04-16 ENCOUNTER — Telehealth (INDEPENDENT_AMBULATORY_CARE_PROVIDER_SITE_OTHER): Payer: BLUE CROSS/BLUE SHIELD | Admitting: Family Medicine

## 2020-04-16 DIAGNOSIS — R21 Rash and other nonspecific skin eruption: Secondary | ICD-10-CM | POA: Diagnosis not present

## 2020-04-16 DIAGNOSIS — F419 Anxiety disorder, unspecified: Secondary | ICD-10-CM

## 2020-04-16 MED ORDER — TRIAMCINOLONE ACETONIDE 0.5 % EX OINT: 1.0000 "application " | TOPICAL_OINTMENT | Freq: Two times a day (BID) | CUTANEOUS | 0 refills | Status: DC

## 2020-04-16 NOTE — Progress Notes (Signed)
   Brenda Krause is a 34 y.o. female who presents today for a virtual office visit.  Assessment/Plan:  New/Acute Problems: Rash Unclear etiology.  Possibly contact dermatitis.  No red flags.  Will start topical triamcinolone for the next week or 2.  If not proving will need in person office visit.  Chronic Problems Addressed Today: Anxiety Worsened.  Advised patient to take 15 mg twice daily regularly to see if this helps.  She agreed with this.  She will check in with me in a couple weeks if not improving.     Subjective:  HPI:  Patient with rash for the past 3 to 4 weeks.  Located on bilateral arms and torso.  No obvious precipitating events.  No new soaps, detergents, or other exposures.  No specific treatments tried.  Area is itchy.  No pain.  No drainage.  No fevers or chills.  She has also had increased stress at home.  She occasionally forgets to take her BuSpar twice daily.       Objective/Observations  Physical Exam: Gen: NAD, resting comfortably Pulm: Normal work of breathing Neuro: Grossly normal, moves all extremities Psych: Normal affect and thought content Skin: Limited somewhat by camera quality though appears to have several scattered urticaria across upper extremities and torso.  Virtual Visit via Video   I connected with Brenda Krause on 04/16/20 at  9:20 AM EDT by a video enabled telemedicine application and verified that I am speaking with the correct person using two identifiers. The limitations of evaluation and management by telemedicine and the availability of in person appointments were discussed. The patient expressed understanding and agreed to proceed.   Patient location: Home Provider location: [place] participating in the virtual visit: Myself and Patient     Algis Greenhouse. Jerline Pain, MD 04/16/2020 9:32 AM

## 2020-04-16 NOTE — Assessment & Plan Note (Addendum)
Worsened.  Advised patient to take 15 mg twice daily regularly to see if this helps.  She agreed with this.  She will check in with me in a couple weeks if not improving.

## 2020-07-10 ENCOUNTER — Telehealth: Payer: Self-pay | Admitting: Family Medicine

## 2020-07-10 NOTE — Telephone Encounter (Signed)
Nurse Assessment Nurse: Joline Salt, RN, Malachy Mood Date/Time Eilene Ghazi Time): 07/10/2020 1:03:20 PM Confirm and document reason for call. If symptomatic, describe symptoms. ---Caller states she has been feeling very fatigued and has also been having abdominal pain. Caller states she feels like she needs to use the restroom and then unable to go. She also has a sore throat for several days. She is vaccinated. Has the patient had close contact with a person known or suspected to have the novel coronavirus illness OR traveled / lives in area with major community spread (including international travel) in the last 14 days from the onset of symptoms? * If Asymptomatic, screen for exposure and travel within the last 14 days. ---Yes Does the patient have any new or worsening symptoms? ---Yes Will a triage be completed? ---Yes Related visit to physician within the last 2 weeks? ---No Does the PT have any chronic conditions? (i.e. diabetes, asthma, this includes High risk factors for pregnancy, etc.) ---No Is the patient pregnant or possibly pregnant? (Ask all females between the ages of 84-55) ---No Is this a behavioral health or substance abuse call? ---NoPLEASE NOTE: All timestamps contained within this report are represented as [place] Standard Time. CONFIDENTIALTY NOTICE: This fax transmission is intended only for the addressee. It contains information that is legally privileged, confidential or otherwise protected from use or disclosure. If you are not the intended recipient, you are strictly prohibited from reviewing, disclosing, copying using or disseminating any of this information or taking any action in reliance on or regarding this information. If you have received this fax in error, please notify us immediately by telephone so that we can arrange for its return to [place]. Phone: [phone], Toll-Free: [phone], Fax: [phone] Page: 2 of 2 Call Id: [id number] Guidelines Guideline Title  Affirmed Question Affirmed Notes Nurse Date/Time Eilene Ghazi Time) COVID-19 - Diagnosed or Suspected Chest pain or pressure Catha Brow 07/10/2020 1:08:41 PM Disp. Time Eilene Ghazi Time) Disposition Final User 07/10/2020 1:14:21 PM Go to ED Now (or PCP triage) Yes Joline Salt, RN, Erskine Speed Disagree/Comply Comply Caller Understands Yes PreDisposition Go to Urgent Care/Walk-In Clinic Care Advice Given Per Guideline GO TO ED NOW (OR PCP TRIAGE)

## 2020-07-11 NOTE — Telephone Encounter (Signed)
Left voice message for patient to call clinic.  

## 2020-07-13 NOTE — Telephone Encounter (Signed)
Patient was seen at urgent care.

## 2020-07-29 ENCOUNTER — Other Ambulatory Visit: Payer: Self-pay | Admitting: Gastroenterology

## 2020-07-30 ENCOUNTER — Other Ambulatory Visit: Payer: Self-pay

## 2020-07-30 MED ORDER — TRULANCE 3 MG PO TABS: 3.0000 mg | ORAL_TABLET | Freq: Every day | ORAL | 0 refills | Status: DC

## 2020-07-30 NOTE — Progress Notes (Signed)
Refill Trulance.  Pt needs an appt for further refills.

## 2020-08-28 ENCOUNTER — Telehealth: Payer: BLUE CROSS/BLUE SHIELD | Admitting: Family Medicine

## 2020-09-23 ENCOUNTER — Other Ambulatory Visit: Payer: Self-pay | Admitting: Gastroenterology

## 2020-11-26 ENCOUNTER — Ambulatory Visit
Admission: EM | Admit: 2020-11-26 | Discharge: 2020-11-26 | Disposition: A | Discharge status: Home / Self Care | Payer: BLUE CROSS/BLUE SHIELD | Attending: Emergency Medicine | Admitting: Emergency Medicine

## 2020-11-26 ENCOUNTER — Other Ambulatory Visit: Payer: Self-pay

## 2020-11-26 DIAGNOSIS — M791 Myalgia, unspecified site: Secondary | ICD-10-CM | POA: Diagnosis not present

## 2020-11-26 DIAGNOSIS — R059 Cough, unspecified: Secondary | ICD-10-CM

## 2020-11-26 DIAGNOSIS — J029 Acute pharyngitis, unspecified: Secondary | ICD-10-CM

## 2020-11-26 MED ORDER — OSELTAMIVIR PHOSPHATE 75 MG PO CAPS: 75.0000 mg | ORAL_CAPSULE | Freq: Two times a day (BID) | ORAL | 0 refills | Status: AC

## 2020-11-26 MED ORDER — BENZONATATE 100 MG PO CAPS: 100.0000 mg | ORAL_CAPSULE | Freq: Three times a day (TID) | ORAL | 0 refills | Status: DC

## 2020-11-26 NOTE — ED Triage Notes (Signed)
Patient states she has had a cough and congestion since yesterday. Patient also complains of body aches since this morning. Pt is aox4 and ambulatory.

## 2020-11-26 NOTE — Discharge Instructions (Addendum)
Please continue Tylenol and/or Ibuprofen as needed for fever, pain.  May try warm salt water gargles, cepacol lozenges, throat spray, warm tea or water with lemon/honey, or OTC cold relief medicine for throat discomfort.   For congestion: take a daily anti-histamine like Zyrtec, Claritin, and a oral decongestant to help with post nasal drip that may be irritating your throat.   It is important to stay hydrated: drink plenty of fluids (primarily water) to keep your throat moisturized and help further relieve irritation/discomfort. 

## 2020-11-26 NOTE — ED Provider Notes (Signed)
[name]    CSN: [id number] Arrival date & time: 11/26/20  1833      History   Chief Complaint Chief Complaint  Patient presents with  . Sore Throat    Since yesterday  . Generalized Body Aches    today  . Nasal Congestion    Sine yesterday     HPI Brenda Krause is a 34 y.o. female  History was provided by the patient. Brenda Krause is a 34 y.o. female who presents for evaluation of a sore throat. Associated symptoms include dry cough, nasal blockage, post nasal drip, sinus and nasal congestion and sore throat. Onset of symptoms was 1 day ago, unchanged since that time.  She is drinking plenty of fluids. She has not had recent close exposure to someone with proven streptococcal pharyngitis.  Requesting covid testing. The following portions of the patient's history were reviewed and updated as appropriate: allergies, current medications, past family history, past medical history, past social history, past surgical history and problem list.     Past Medical History:  Diagnosis Date  . Allergic rhinitis, cause unspecified   . Dyspepsia   . Esophageal reflux   . Gastritis   . Secondhand smoke exposure     Patient Active Problem List   Diagnosis Date Noted  . Anxiety 01/10/2020  . Dysphagia 10/06/2017  . GERD (gastroesophageal reflux disease) 06/29/2013  . HEADACHE 10/29/2010  . ALLERGIC RHINITIS 02/27/2010    Past Surgical History:  Procedure Laterality Date  . BREATH TEK H PYLORI N/A 12/28/2013   Procedure: BREATH TEK H PYLORI;  Surgeon: Sable Feil, MD;  Location: [facility];  Service: Endoscopy;  Laterality: N/A;  . TOOTH EXTRACTION      OB History   No obstetric history on file.      Home Medications    Prior to Admission medications   Medication Sig Start Date End Date Taking? Authorizing Provider  acetaminophen (TYLENOL) 325 MG tablet Take 650 mg by mouth every 6 (six) hours as needed.   Yes [provider]   benzonatate (TESSALON) 100 MG capsule Take 1 capsule (100 mg total) by mouth every 8 (eight) hours. 11/26/20  Yes Hall-Potvin, [place], PA-C  esomeprazole (NEXIUM) 40 MG capsule Take 1 capsule (40 mg total) by mouth 2 (two) times daily before a meal. Please schedule an office visit for further refills. Last seen 04-2019 07/30/20  Yes Armbruster, Carlota Raspberry, MD  fluticasone (FLONASE) 50 MCG/ACT nasal spray Place 2 sprays into both nostrils daily.   Yes [provider]  montelukast (SINGULAIR) 10 MG tablet Take 10 mg by mouth at bedtime.   Yes [provider]  oseltamivir (TAMIFLU) 75 MG capsule Take 1 capsule (75 mg total) by mouth 2 (two) times daily for 5 days. 11/26/20 12/01/20 Yes Hall-Potvin, [place], PA-C  Plecanatide (TRULANCE) 3 MG TABS Take 1 tablet by mouth daily. Please schedule a Follow up visit for further refills. Thank you 09/24/20  Yes Armbruster, Carlota Raspberry, MD  busPIRone (BUSPAR) 7.5 MG tablet Take 2 tablets (15 mg total) by mouth 2 (two) times daily. 08/29/19   Armbruster, Carlota Raspberry, MD  ipratropium (ATROVENT) 0.06 % nasal spray Place 2 sprays into both nostrils 4 (four) times daily. 01/27/19   Vivi Barrack, MD  triamcinolone ointment (KENALOG) 0.5 % Apply 1 application topically 2 (two) times daily. 04/16/20   Vivi Barrack, MD    Family History Family History  Problem Relation Age of Onset  .  Migraines Mother   . Hypertension Mother   . Hearing loss Father   . Migraines Father   . Hypertension Father   . Hearing loss Brother     Social History Social History   Tobacco Use  . Smoking status: Never Smoker  . Smokeless tobacco: Never Used  Vaping Use  . Vaping Use: Never used  Substance Use Topics  . Alcohol use: Yes    Comment: Social   . Drug use: No     Allergies   Patient has no known allergies.   Review of Systems Review of Systems  Constitutional: Negative for fatigue and fever.  HENT: Positive for congestion and sore throat. Negative  for ear pain, sinus pain and voice change.   Eyes: Negative for pain, redness and visual disturbance.  Respiratory: Positive for cough. Negative for shortness of breath.   Cardiovascular: Negative for chest pain and palpitations.  Gastrointestinal: Negative for abdominal pain, diarrhea and vomiting.  Musculoskeletal: Positive for myalgias. Negative for arthralgias.  Skin: Negative for rash and wound.  Neurological: Negative for syncope and headaches.     Physical Exam Triage Vital Signs ED Triage Vitals  Enc Vitals Group     BP 11/26/20 2002 113/78     Pulse Rate 11/26/20 2002 (!) 118     Resp 11/26/20 2002 20     Temp 11/26/20 2002 (!) 101.8 F (38.8 C)     Temp Source 11/26/20 2002 Oral     SpO2 11/26/20 2002 97 %     Weight --      Height --      Head Circumference --      Peak Flow --      Pain Score 11/26/20 2008 0     Pain Loc --      Pain Edu? --      Excl. in [place]? --    No data found.  Updated Vital Signs BP 113/78 (BP Location: Left Arm)   Pulse (!) 118   Temp (!) 101.8 F (38.8 C) (Oral)   Resp 20   SpO2 97%   Visual Acuity Right Eye Distance:   Left Eye Distance:   Bilateral Distance:    Right Eye Near:   Left Eye Near:    Bilateral Near:     Physical Exam Constitutional:      General: She is not in acute distress.    Appearance: She is not ill-appearing or diaphoretic.  HENT:     Head: Normocephalic and atraumatic.     Right Ear: Tympanic membrane and ear canal normal.     Left Ear: Tympanic membrane and ear canal normal.     Mouth/Throat:     Mouth: Mucous membranes are moist.     Pharynx: Oropharynx is clear. No oropharyngeal exudate or posterior oropharyngeal erythema.  Eyes:     General: No scleral icterus.    Conjunctiva/sclera: Conjunctivae normal.     Pupils: Pupils are equal, round, and reactive to light.  Neck:     Comments: Trachea midline, negative JVD Cardiovascular:     Rate and Rhythm: Normal rate and regular rhythm.      Heart sounds: No murmur heard. No gallop.   Pulmonary:     Effort: Pulmonary effort is normal. No respiratory distress.     Breath sounds: No wheezing, rhonchi or rales.  Musculoskeletal:     Cervical back: Neck supple. No tenderness.  Lymphadenopathy:     Cervical: No cervical adenopathy.  Skin:  Capillary Refill: Capillary refill takes less than 2 seconds.     Coloration: Skin is not jaundiced or pale.     Findings: No rash.  Neurological:     General: No focal deficit present.     Mental Status: She is alert and oriented to person, place, and time.      UC Treatments / Results  Labs (all labs ordered are listed, but only abnormal results are displayed) Labs Reviewed  COVID-19, FLU A+B NAA    EKG   Radiology No results found.  Procedures Procedures (including critical care time)  Medications Ordered in UC Medications - No data to display  Initial Impression / Assessment and Plan / UC Course  I have reviewed the triage vital signs and the nursing notes.  Pertinent labs & imaging results that were available during my care of the patient were reviewed by me and considered in my medical decision making (see chart for details).     Patient afebrile, nontoxic, with SpO2 97%.  Covid PCR pending.  Patient to quarantine until results are back.  We will treat supportively as outlined below, cover for influenza given myalgias and sudden onset..  Return precautions discussed, patient verbalized understanding and is agreeable to plan. Final Clinical Impressions(s) / UC Diagnoses   Final diagnoses:  Sore throat  Cough  Myalgia     Discharge Instructions     Please continue Tylenol and/or Ibuprofen as needed for fever, pain.  May try warm salt water gargles, cepacol lozenges, throat spray, warm tea or water with lemon/honey, or OTC cold relief medicine for throat discomfort.   For congestion: take a daily anti-histamine like Zyrtec, Claritin, and a oral decongestant  to help with post nasal drip that may be irritating your throat.   It is important to stay hydrated: drink plenty of fluids (primarily water) to keep your throat moisturized and help further relieve irritation/discomfort.     ED Prescriptions    Medication Sig Dispense Auth. Provider   benzonatate (TESSALON) 100 MG capsule Take 1 capsule (100 mg total) by mouth every 8 (eight) hours. 21 capsule Hall-Potvin, [place], PA-C   oseltamivir (TAMIFLU) 75 MG capsule Take 1 capsule (75 mg total) by mouth 2 (two) times daily for 5 days. 10 capsule Hall-Potvin, [place], PA-C     PDMP not reviewed this encounter.   Hall-Potvin, [place], [place] 11/26/20 2030

## 2020-11-29 ENCOUNTER — Telehealth (INDEPENDENT_AMBULATORY_CARE_PROVIDER_SITE_OTHER): Payer: BLUE CROSS/BLUE SHIELD | Admitting: Family Medicine

## 2020-11-29 DIAGNOSIS — H5789 Other specified disorders of eye and adnexa: Secondary | ICD-10-CM | POA: Diagnosis not present

## 2020-11-29 DIAGNOSIS — R059 Cough, unspecified: Secondary | ICD-10-CM

## 2020-11-29 DIAGNOSIS — R0981 Nasal congestion: Secondary | ICD-10-CM | POA: Diagnosis not present

## 2020-11-29 LAB — COVID-19, FLU A+B NAA
Influenza A, NAA: NOT DETECTED
Influenza B, NAA: NOT DETECTED
SARS-CoV-2, NAA: DETECTED — AB

## 2020-11-29 MED ORDER — ERYTHROMYCIN 5 MG/GM OP OINT: 1.0000 "application " | TOPICAL_OINTMENT | Freq: Every day | OPHTHALMIC | 0 refills | Status: AC

## 2020-11-29 NOTE — Patient Instructions (Addendum)
   ---------------------------------------------------------------------------------------------------------------------------      WORK SLIP:  Patient Brenda Krause,  [birth year], was seen for a medical visit today, 11/29/20 . Please excuse from work according to the [facility] guidelines for a COVID like illness. We advise 10 days minimum from the onset of symptoms (11/25/20) PLUS 1 day of no fever and improved symptoms. Will defer to employer for a sooner return to work if COVID19 testing is negative and the symptoms have resolved. Advise following CDC guidelines.   Sincerely: E-signature: Dr. Kriste Basque, DO [facility] Ph: [phone]   ------------------------------------------------------------------------------------------------------------------------------    HOME CARE TIPS:  -I sent the medication(s) we discussed to your pharmacy: Meds ordered this encounter  Medications  . erythromycin ophthalmic ointment    Sig: Place 1 application into the left eye at bedtime.    Dispense:  3.5 g    Refill:  0     -can use tylenol or aleve if needed for fevers, aches and pains per instructions  -can use nasal saline a few times per day if nasal congestion, sometime a short course of Afrin nasal spray for 3 days can help as well  -stay hydrated, drink plenty of fluids and eat small healthy meals - avoid dairy  -If the Covid test is positive, check out the CDC website for more information on home care, transmission and treatment for COVID19  -follow up with your doctor in 2-3 days unless improving and feeling better  -stay home while sick, except to seek medical care, and if you have COVID19 please stay home for a full 10 days since the onset of symptoms PLUS one day of no fever and feeling better.  It was nice to meet you today, and I really hope you are feeling better soon. I help [facility] out with telemedicine visits on Tuesdays and Thursdays and am available for  visits on those days. If you have any concerns or questions following this visit please schedule a follow up visit with your Primary Care doctor or seek care at a local urgent care clinic to avoid delays in care.    Seek in person care promptly if your symptoms worsen, new concerns arise or you are not improving with treatment. Call 911 and/or seek emergency care if you symptoms are severe or life threatening.
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
## 2020-12-14 ENCOUNTER — Telehealth: Payer: Self-pay

## 2020-12-14 NOTE — Telephone Encounter (Signed)
Patient is requesting to get a new RX written by dr.parker for  busPIRone (BUSPAR) 7.5 MG tablet sent to [facility], [address] AT [place] the original RX was written by her GI dr. And needs to to be refilled but they did not approve her for her medication to be refilled  Please advise

## 2020-12-14 NOTE — Telephone Encounter (Signed)
Ok with me. Please place any necessary orders. 

## 2020-12-14 NOTE — Telephone Encounter (Signed)
  busPIRone (BUSPAR) 7.5 MG tablet

## 2020-12-14 NOTE — Telephone Encounter (Signed)
See below

## 2020-12-17 MED ORDER — BUSPIRONE HCL 7.5 MG PO TABS
15.0000 mg | ORAL_TABLET | Freq: Two times a day (BID) | ORAL | 2 refills | Status: DC
Start: 2020-12-17 — End: 2022-03-27

## 2020-12-17 NOTE — Addendum Note (Signed)
Addended by: Marian Sorrow on: 12/17/2020 09:41 AM   Modules accepted: Orders

## 2020-12-17 NOTE — Telephone Encounter (Signed)
Left message on voicemail Rx was sent to pharmacy as requested. Any questions please call office.

## 2021-01-08 ENCOUNTER — Encounter: Payer: Self-pay | Admitting: Family Medicine

## 2021-01-08 ENCOUNTER — Other Ambulatory Visit: Payer: Self-pay

## 2021-01-08 ENCOUNTER — Ambulatory Visit (INDEPENDENT_AMBULATORY_CARE_PROVIDER_SITE_OTHER): Payer: BLUE CROSS/BLUE SHIELD

## 2021-01-08 ENCOUNTER — Ambulatory Visit (INDEPENDENT_AMBULATORY_CARE_PROVIDER_SITE_OTHER): Payer: BLUE CROSS/BLUE SHIELD | Admitting: Family Medicine

## 2021-01-08 VITALS — BP 111/76 | HR 91 | Temp 98.4°F | Ht 61.0 in | Wt 141.2 lb

## 2021-01-08 DIAGNOSIS — F419 Anxiety disorder, unspecified: Secondary | ICD-10-CM

## 2021-01-08 DIAGNOSIS — R0602 Shortness of breath: Secondary | ICD-10-CM

## 2021-01-08 LAB — CBC
HCT: 37 % (ref 36.0–46.0)
Hemoglobin: 12.4 g/dL (ref 12.0–15.0)
MCHC: 33.6 g/dL (ref 30.0–36.0)
MCV: 71.7 fl — ABNORMAL LOW (ref 78.0–100.0)
Platelets: 380 10*3/uL (ref 150.0–400.0)
RBC: 5.16 Mil/uL — ABNORMAL HIGH (ref 3.87–5.11)
RDW: 15.4 % (ref 11.5–15.5)
WBC: 8.7 10*3/uL (ref 4.0–10.5)

## 2021-01-08 LAB — COMPREHENSIVE METABOLIC PANEL
ALT: 16 U/L (ref 0–35)
AST: 18 U/L (ref 0–37)
Albumin: 4.5 g/dL (ref 3.5–5.2)
Alkaline Phosphatase: 40 U/L (ref 39–117)
BUN: 14 mg/dL (ref 6–23)
CO2: 27 mEq/L (ref 19–32)
Calcium: 9.3 mg/dL (ref 8.4–10.5)
Chloride: 105 mEq/L (ref 96–112)
Creatinine, Ser: 0.79 mg/dL (ref 0.40–1.20)
GFR: 96.23 mL/min (ref >60.00)
Glucose, Bld: 90 mg/dL (ref 70–99)
Potassium: 3.9 mEq/L (ref 3.5–5.1)
Sodium: 138 mEq/L (ref 135–145)
Total Bilirubin: 0.4 mg/dL (ref 0.2–1.2)
Total Protein: 7.9 g/dL (ref 6.0–8.3)

## 2021-01-08 LAB — TSH: TSH: 1.64 u[IU]/mL (ref 0.35–4.50)

## 2021-01-08 NOTE — Progress Notes (Signed)
   Brenda Krause is a 35 y.o. female who presents today for an office visit.  Assessment/Plan:  New/Acute Problems: Shortness of Breath Likely sequela of COVID-19 infection. Normal lung exam today. Given length of symptoms will check chest x-ray. May need PFTs depending on results of chest x-ray and progression of symptoms. Also check labs today including CBC and CMET TSH.  Chronic Problems Addressed Today: Anxiety Worsened. She is on BuSpar 7.5 mg twice daily. She has tried taking 15 mg daily but feels like it may be too strong. She does not want to make any medication adjustments today. Discussed possibly trying 10 mg twice daily. She will check with me in a few weeks if she would like to make a change.     Subjective:  HPI:  Patient here with shortness of breath for the last 6 weeks. She had COVID 6 weeks ago. At that time was having several symptoms including cough, sore throat, nasal congestion, and eye irritation. All of her symptoms have resolved with the exception of shortness of breath. Worse with talking. Worse with exertion.  No specific treatments tried. Symptoms are overall stable.  She is also had worsened anxiety. She has been under quite a bit of stress recently due to to issues with the health of family members.        Objective:  Physical Exam: BP 111/76   Pulse 91   Temp 98.4 F (36.9 C) (Temporal)   Ht 5\' 1"  (1.549 m)   Wt 141 lb 3.2 oz (64 kg)   LMP 01/01/2021   SpO2 98%   BMI 26.68 kg/m   Gen: No acute distress, resting comfortably CV: Regular rate and rhythm with no murmurs appreciated Pulm: Normal work of breathing, clear to auscultation bilaterally with no crackles, wheezes, or rhonchi Neuro: Grossly normal, moves all extremities Psych: Normal affect and thought content      Valarie Farace M. Jerline Pain, MD 01/08/2021 11:08 AM

## 2021-01-08 NOTE — Progress Notes (Signed)
Please inform patient of the following:  Blood work and xray all normal. Recommend referral to pulmonology for pulmonary function tests if she is still having shortness of breath.  Algis Greenhouse. Jerline Pain, MD 01/08/2021 4:15 PM

## 2021-01-08 NOTE — Assessment & Plan Note (Signed)
Worsened. She is on BuSpar 7.5 mg twice daily. She has tried taking 15 mg daily but feels like it may be too strong. She does not want to make any medication adjustments today. Discussed possibly trying 10 mg twice daily. She will check with me in a few weeks if she would like to make a change.

## 2021-01-08 NOTE — Patient Instructions (Signed)
It was very nice to see you today!  Your shortness of breath should improve over the next few weeks hopefully. We will check an x-ray today and blood work to make sure there is nothing else is going on. Please let me know if you would like to change your medications.  Take care, Dr Jerline Pain  Please try these tips to maintain a healthy lifestyle:   Eat at least 3 REAL meals and 1-2 snacks per day.  Aim for no more than 5 hours between eating.  If you eat breakfast, please do so within one hour of getting up.    Each meal should contain half fruits/vegetables, one quarter protein, and one quarter carbs (no bigger than a computer mouse)   Cut down on sweet beverages. This includes juice, soda, and sweet tea.     Drink at least 1 glass of water with each meal and aim for at least 8 glasses per day   Exercise at least 150 minutes every week.

## 2021-01-10 ENCOUNTER — Other Ambulatory Visit: Payer: Self-pay

## 2021-01-10 DIAGNOSIS — R0602 Shortness of breath: Secondary | ICD-10-CM

## 2021-01-29 ENCOUNTER — Ambulatory Visit: Payer: BLUE CROSS/BLUE SHIELD | Admitting: Physician Assistant

## 2021-02-19 ENCOUNTER — Encounter: Payer: Self-pay | Admitting: Physician Assistant

## 2021-02-19 ENCOUNTER — Ambulatory Visit (INDEPENDENT_AMBULATORY_CARE_PROVIDER_SITE_OTHER): Payer: Self-pay | Admitting: Physician Assistant

## 2021-02-19 VITALS — BP 104/66 | HR 64 | Ht 61.0 in | Wt 141.0 lb

## 2021-02-19 DIAGNOSIS — K5909 Other constipation: Secondary | ICD-10-CM

## 2021-02-19 DIAGNOSIS — R1310 Dysphagia, unspecified: Secondary | ICD-10-CM

## 2021-02-19 DIAGNOSIS — K219 Gastro-esophageal reflux disease without esophagitis: Secondary | ICD-10-CM

## 2021-02-19 DIAGNOSIS — K31A0 Gastric intestinal metaplasia, unspecified: Secondary | ICD-10-CM

## 2021-02-19 DIAGNOSIS — R1013 Epigastric pain: Secondary | ICD-10-CM

## 2021-02-19 MED ORDER — ESOMEPRAZOLE MAGNESIUM 40 MG PO CPDR: 40.0000 mg | DELAYED_RELEASE_CAPSULE | Freq: Two times a day (BID) | ORAL | 5 refills | Status: AC

## 2021-02-19 MED ORDER — TRULANCE 3 MG PO TABS: 1.0000 | ORAL_TABLET | Freq: Every day | ORAL | 11 refills | Status: DC

## 2021-02-19 NOTE — Progress Notes (Signed)
Chief Complaint:  HPI:    Brenda Krause is a 35 year old [place] female with a past medical history as listed below, known to Dr. Havery Moros, who presents to clinic today for follow-up of her dyspepsia.    04/18/2019 patient seen virtually by Dr. Havery Moros.  That time as discussed he had seen her last in February 2019 and been evaluated for dyspepsia, reflux, dysphagia.  She had had an EGD, barium swallow and ultrasound of the abdomen for these issues.  Her dyspepsia was chronic and in general seem to mild, her main complaint is of ongoing postprandial epigastric discomfort.  She had been started on BuSpar at the last visit, she was taking it as needed for "stress", but had not been using it routinely.  Also discussed some globus and dysphagia if she ate quickly.  She had been taking Prilosec Trulance as needed for constipation.  At that time as discussed like that she had functional bowel disorder/dyspepsia and it was recommended she use her buspirone twice daily every day and titrate up as needed.  Is explained that after a good trial this did not help then could consider a CT.  Refilled Nexium for GERD and dysphagia.  Continued Trulance.  Also recommend MiraLAX twice daily at baseline.  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    Today, the patient presents to clinic and is a poor historian, unable to tell me what she has really been taking and what is really bothering her.  She tells me she has a lot going on with her kids and their own health problems.  Explains that when she was taking the Trulance she was having better bowel movements but felt bad about being on medicine every day so when she ran out she stopped using this.  In the meantime has been try MiraLAX which is really not work for her.  Describes bloating.    Also complains of reflux symptoms.  Apparently was taking Nexium once a day and it was not really  working for her so when this prescription ran out she stopped taking it.  Occasionally she will pick some up over-the-counter but still does not really work.  Tells me she is taking her Buspirone 7.5 mg, 2 tablets twice a day and "I could not leave without it".  Also describes some difficulty swallowing pills at times.       Denies fever, chills, weight loss or blood in her stool.  Prior workup: EGD 12/09/2017 - 2cm HH, mild GEJ stenosis, dilated to 49mm without any mucosal wrents, biopsies taken to rule out EoE and H pylori - no evidence of EoE or H pylori however there is presence of gastric intestinal metaplasia Esophogram 06/23/17 - distal esophageal stricture, delayed tablet - low grade peptic stricture US abdomen - 06/21/13 - normal  Past Medical History:  Diagnosis Date  . Allergic rhinitis, cause unspecified   . Dyspepsia   . Esophageal reflux   . Gastritis   . Secondhand smoke exposure     Past Surgical History:  Procedure Laterality Date  . BREATH TEK H PYLORI N/A 12/28/2013   Procedure: BREATH TEK H PYLORI;  Surgeon: Sable Feil, MD;  Location: [facility];  Service: Endoscopy;  Laterality: N/A;  . TOOTH EXTRACTION      Current Outpatient Medications  Medication Sig Dispense Refill  . acetaminophen (TYLENOL) 325 MG tablet Take 650 mg by mouth every  6 (six) hours as needed.    . busPIRone (BUSPAR) 7.5 MG tablet Take 2 tablets (15 mg total) by mouth 2 (two) times daily. 120 tablet 2  . erythromycin ophthalmic ointment Place 1 application into the left eye at bedtime. 3.5 g 0  . esomeprazole (NEXIUM) 40 MG capsule Take 1 capsule (40 mg total) by mouth 2 (two) times daily before a meal. Please schedule an office visit for further refills. Last seen 04-2019 (Patient not taking: Reported on 01/08/2021) 60 capsule 1  . fluticasone (FLONASE) 50 MCG/ACT nasal spray Place 2 sprays into both nostrils daily.    Marland Kitchen ipratropium (ATROVENT) 0.06 % nasal spray Place 2 sprays into both  nostrils 4 (four) times daily. 15 mL 0  . montelukast (SINGULAIR) 10 MG tablet Take 10 mg by mouth at bedtime.    Marland Kitchen Plecanatide (TRULANCE) 3 MG TABS Take 1 tablet by mouth daily. Please schedule a Follow up visit for further refills. Thank you 30 tablet 1   No current facility-administered medications for this visit.    Allergies as of 02/19/2021  . (No Known Allergies)    Family History  Problem Relation Age of Onset  . Migraines Mother   . Hypertension Mother   . Hearing loss Father   . Migraines Father   . Hypertension Father   . Hearing loss Brother     Social History   Socioeconomic History  . Marital status: Single    Spouse name: Not on file  . Number of children: 2  . Years of education: 63  . Highest education level: Not on file  Occupational History    Employer: [employer]  Tobacco Use  . Smoking status: Never Smoker  . Smokeless tobacco: Never Used  Vaping Use  . Vaping Use: Never used  Substance and Sexual Activity  . Alcohol use: Yes    Comment: Social   . Drug use: No  . Sexual activity: Yes  Other Topics Concern  . Not on file  Social History Narrative   Patient is single, has 2 children   Patient is right handed   Education level 12   Caffeine consumption is 1 cup daily   Social Determinants of Health   Financial Resource Strain: Not on file  Food Insecurity: Not on file  Transportation Needs: Not on file  Physical Activity: Not on file  Stress: Not on file  Social Connections: Not on file  Intimate Partner Violence: Not on file    Review of Systems:    Constitutional: No weight loss, fever or chills Cardiovascular: No chest pain Respiratory: No SOB Gastrointestinal: See HPI and otherwise negative   Physical Exam:  Vital signs: BP 104/66   Pulse 64   Ht 5\' 1"  (1.549 m)   Wt 141 lb (64 kg)   BMI 26.64 kg/m   Constitutional:   Pleasant [place] female appears to be in NAD, Well developed, Well nourished, alert and  cooperative Head:  Normocephalic and atraumatic. Eyes:   PEERL, EOMI. No icterus. Conjunctiva pink. Ears:  Normal auditory acuity. Neck:  Supple Throat: Oral cavity and pharynx without inflammation, swelling or lesion.  Respiratory: Respirations even and unlabored. Lungs clear to auscultation bilaterally.   No wheezes, crackles, or rhonchi.  Cardiovascular: Normal S1, S2. No MRG. Regular rate and rhythm. No peripheral edema, cyanosis or pallor.  Gastrointestinal:  Soft, nondistended, nontender. No rebound or guarding. Normal bowel sounds. No appreciable masses or hepatomegaly. Rectal:  Not performed.  Msk:  Symmetrical  without gross deformities. Without edema, no deformity or joint abnormality.  Neurologic:  Alert and  oriented x4;  grossly normal neurologically.  Skin:   Dry and intact without significant lesions or rashes. Psychiatric: +anxious Demonstrates good judgement and reason without abnormal affect or behaviors.  RELEVANT LABS AND IMAGING: CBC    Component Value Date/Time   WBC 8.7 01/08/2021 1108   RBC 5.16 (H) 01/08/2021 1108   HGB 12.4 01/08/2021 1108   HCT 37.0 01/08/2021 1108   PLT 380.0 01/08/2021 1108   MCV 71.7 (L) 01/08/2021 1108   MCH 24.4 (L) 06/21/2013 1902   MCHC 33.6 01/08/2021 1108   RDW 15.4 01/08/2021 1108   LYMPHSABS 2.8 01/20/2018 1038   MONOABS 0.6 01/20/2018 1038   EOSABS 0.1 01/20/2018 1038   BASOSABS 0.1 01/20/2018 1038    CMP     Component Value Date/Time   NA 138 01/08/2021 1108   K 3.9 01/08/2021 1108   CL 105 01/08/2021 1108   CO2 27 01/08/2021 1108   GLUCOSE 90 01/08/2021 1108   BUN 14 01/08/2021 1108   CREATININE 0.79 01/08/2021 1108   CALCIUM 9.3 01/08/2021 1108   PROT 7.9 01/08/2021 1108   ALBUMIN 4.5 01/08/2021 1108   AST 18 01/08/2021 1108   ALT 16 01/08/2021 1108   ALKPHOS 40 01/08/2021 1108   BILITOT 0.4 01/08/2021 1108   GFRNONAA >90 06/21/2013 1902   GFRAA >90 06/21/2013 1902    Assessment: 1.  GERD: Continues with  symptoms of reflux 2.  Dysphagia: Describes pills getting stuck on the way down; consider stricture versus other 3.  Constipation: MiraLAX is of no help, Trulance helped in the past but she ran out of medication 4.  Dyspepsia: Some of the symptoms are better with BuSpar 7.5 mg, 2 tabs twice a day 5.  History of gastric intestinal metaplasia: At time of last EGD in January 2019 with recommendations for repeat in 3 years for surveillance  Plan: 1.  Scheduled patient for repeat EGD given history of gastric intestinal metaplasia and recommendations from Dr. Havery Moros during their last discussion.  Patient was provided with a detailed list of risks for the procedure and she agrees to proceed.  We will schedule this out at least a month from now to allow patient time to call her insurance about covering this for her.  Apparently we are now out of network. 2.  Refilled Nexium, increased to 40 mg twice daily, 30-60 minutes before breakfast and dinner #60 with 5 refills.  Pending results of EGD she may not need to be on this high dose of medicine, it again could just be functional. 3.  Refilled Trulance to be taken once daily #30 with 5 refills. 4.  Patient will call if she needs a refill of BuSpar which I recommend she stay on. 5.  Patient to follow in clinic per recommendations from Dr. Havery Moros after time of procedure.  Ellouise Newer, PA-C [facility] 02/19/2021, 11:04 AM  Cc: Vivi Barrack, MD

## 2021-02-19 NOTE — Patient Instructions (Signed)
If you are age 37 or older, your body mass index should be between 23-30. Your Body mass index is 26.64 kg/m. If this is out of the aforementioned range listed, please consider follow up with your Primary Care Provider.  If you are age 39 or younger, your body mass index should be between 19-25. Your Body mass index is 26.64 kg/m. If this is out of the aformentioned range listed, please consider follow up with your Primary Care Provider.   We have sent the following medications to your pharmacy for you to pick up at your convenience: Nexium 40 mg and Trulance.  Gastric intestinal metaplasia this is the reason for your EGD.   You have been scheduled for an endoscopy. Please follow written instructions given to you at your visit today. If you use inhalers (even only as needed), please bring them with you on the day of your procedure.

## 2021-02-19 NOTE — Progress Notes (Signed)
Agree with assessment and plan as outlined.  

## 2021-04-08 ENCOUNTER — Other Ambulatory Visit: Payer: Self-pay

## 2021-04-08 ENCOUNTER — Encounter: Payer: Self-pay | Admitting: Gastroenterology

## 2021-04-08 ENCOUNTER — Ambulatory Visit (AMBULATORY_SURGERY_CENTER): Payer: BLUE CROSS/BLUE SHIELD | Admitting: Gastroenterology

## 2021-04-08 ENCOUNTER — Other Ambulatory Visit: Payer: Self-pay | Admitting: Gastroenterology

## 2021-04-08 VITALS — BP 111/74 | HR 73 | Temp 99.3°F | Resp 15 | Ht 61.0 in | Wt 141.0 lb

## 2021-04-08 DIAGNOSIS — R1310 Dysphagia, unspecified: Secondary | ICD-10-CM | POA: Diagnosis not present

## 2021-04-08 DIAGNOSIS — K31A0 Gastric intestinal metaplasia, unspecified: Secondary | ICD-10-CM

## 2021-04-08 DIAGNOSIS — K219 Gastro-esophageal reflux disease without esophagitis: Secondary | ICD-10-CM

## 2021-04-08 DIAGNOSIS — K31A11 Gastric intestinal metaplasia without dysplasia, involving the antrum: Secondary | ICD-10-CM | POA: Diagnosis not present

## 2021-04-08 MED ORDER — SODIUM CHLORIDE 0.9 % IV SOLN: 500.0000 mL | INTRAVENOUS | Status: DC

## 2021-04-08 NOTE — Progress Notes (Signed)
Pt Drowsy. VSS. To PACU, report to RN. No anesthetic complications noted.  

## 2021-04-08 NOTE — Progress Notes (Signed)
Called to room to assist during endoscopic procedure.  Patient ID and intended procedure confirmed with present staff. Received instructions for my participation in the procedure from the performing physician.  

## 2021-04-08 NOTE — Patient Instructions (Signed)
YOU HAD AN ENDOSCOPIC PROCEDURE TODAY AT THE [facility]:   Refer to the procedure report that was given to you for any specific questions about what was found during the examination.  If the procedure report does not answer your questions, please call your gastroenterologist to clarify.  If you requested that your care partner not be given the details of your procedure findings, then the procedure report has been included in a sealed envelope for you to review at your convenience later.  YOU SHOULD EXPECT: Some feelings of bloating in the abdomen. Passage of more gas than usual.  Walking can help get rid of the air that was put into your GI tract during the procedure and reduce the bloating. If you had a lower endoscopy (such as a colonoscopy or flexible sigmoidoscopy) you may notice spotting of blood in your stool or on the toilet paper. If you underwent a bowel prep for your procedure, you may not have a normal bowel movement for a few days.  Please Note:  You might notice some irritation and congestion in your nose or some drainage.  This is from the oxygen used during your procedure.  There is no need for concern and it should clear up in a day or so.  SYMPTOMS TO REPORT IMMEDIATELY:   Following lower endoscopy (colonoscopy or flexible sigmoidoscopy):  Excessive amounts of blood in the stool  Significant tenderness or worsening of abdominal pains  Swelling of the abdomen that is new, acute  Fever of 100F or higher   Following upper endoscopy (EGD)  Vomiting of blood or coffee ground material  New chest pain or pain under the shoulder blades  Painful or persistently difficult swallowing  New shortness of breath  Fever of 100F or higher  Black, tarry-looking stools  For urgent or emergent issues, a gastroenterologist can be reached at any hour by calling [phone]. Do not use MyChart messaging for urgent concerns.    DIET:  We do recommend a small meal at first, but  then you may proceed to your regular diet.  Drink plenty of fluids but you should avoid alcoholic beverages for 24 hours.  ACTIVITY:  You should plan to take it easy for the rest of today and you should NOT DRIVE or use heavy machinery until tomorrow (because of the sedation medicines used during the test).    FOLLOW UP: Our staff will call the number listed on your records 48-72 hours following your procedure to check on you and address any questions or concerns that you may have regarding the information given to you following your procedure. If we do not reach you, we will leave a message.  We will attempt to reach you two times.  During this call, we will ask if you have developed any symptoms of COVID 19. If you develop any symptoms (ie: fever, flu-like symptoms, shortness of breath, cough etc.) before then, please call [phone].  If you test positive for Covid 19 in the 2 weeks post procedure, please call and report this information to us.    If any biopsies were taken you will be contacted by phone or by letter within the next 1-3 weeks.  Please call us at [phone] if you have not heard about the biopsies in 3 weeks.    SIGNATURES/CONFIDENTIALITY: You and/or your care partner have signed paperwork which will be entered into your electronic medical record.  These signatures attest to the fact that that the information above on   your After Visit Summary has been reviewed and is understood.  Full responsibility of the confidentiality of this discharge information lies with you and/or your care-partner. 

## 2021-04-08 NOTE — Op Note (Signed)
[facility] Patient Name: Brenda Krause Procedure Date: 04/08/2021 9:57 AM MRN: [id number] Endoscopist: Remo Lipps P. Havery Moros , MD Age: 35 Referring MD:  Date of Birth: [birth year] Gender: Female Account #: [id number] Procedure:                Upper GI endoscopy Indications:              Surveillance for history of gastric intestinal                            metaplasia, Dysphagia (mostly pills in oropharynx),                            history of GERD on nexium Medicines:                Monitored Anesthesia Care Procedure:                Pre-Anesthesia Assessment:                           - Prior to the procedure, a History and Physical                            was performed, and patient medications and                            allergies were reviewed. The patient's tolerance of                            previous anesthesia was also reviewed. The risks                            and benefits of the procedure and the sedation                            options and risks were discussed with the patient.                            All questions were answered, and informed consent                            was obtained. Prior Anticoagulants: The patient has                            taken no previous anticoagulant or antiplatelet                            agents. ASA Grade Assessment: II - A patient with                            mild systemic disease. After reviewing the risks                            and benefits, the patient was deemed in  satisfactory condition to undergo the procedure.                           After obtaining informed consent, the endoscope was                            passed under direct vision. Throughout the                            procedure, the patient's blood pressure, pulse, and                            oxygen saturations were monitored continuously. The                            Endoscope was introduced  through the mouth, and                            advanced to the second part of duodenum. The upper                            GI endoscopy was accomplished without difficulty.                            The patient tolerated the procedure well. Scope In: Scope Out: Findings:                 Esophagogastric landmarks were identified: the                            Z-line was found at 36 cm, the gastroesophageal                            junction was found at 36 cm and the upper extent of                            the gastric folds was found at 36 cm from the                            incisors.                           The exam of the esophagus was otherwise normal. No                            overt stenosis / stricture noted                           A guidewire was placed and the scope was withdrawn.                            Given the patient's symptoms empiric dilation was  performed in the entire esophagus with a Savary                            dilator with mild resistance at 17 mm and 18 mm.                            Relook endoscopy showed no mucosal wrents.                           The entire examined stomach was normal. Biopsies                            were taken with a cold forceps for histology in all                            portions for surveillance of gastric intestinal                            metaplasia.                           The exam of the stomach was otherwise normal.                           The duodenal bulb and second portion of the                            duodenum were normal. Complications:            No immediate complications. Estimated blood loss:                            Minimal. Estimated Blood Loss:     Estimated blood loss was minimal. Impression:               - Esophagogastric landmarks identified.                           - Normal esophagus otherwise - empiric dilation                             performed to 83mm                           - Normal stomach. Biopsied for surveillance of GIM.                           - Normal duodenal bulb and second portion of the                            duodenum. Recommendation:           - Patient has a contact number available for                            emergencies. The signs and symptoms of potential  delayed complications were discussed with the                            patient. Return to normal activities tomorrow.                            Written discharge instructions were provided to the                            patient.                           - Resume previous diet, await course post empiric                            dilation                           - Continue present medications.                           - Await pathology results. Remo Lipps P. Oaklynn Stierwalt, MD 04/08/2021 10:27:18 AM This report has been signed electronically.

## 2021-04-10 ENCOUNTER — Telehealth: Payer: Self-pay

## 2021-04-10 NOTE — Telephone Encounter (Signed)
Second post procedure follow up call, no answer 

## 2021-04-10 NOTE — Telephone Encounter (Signed)
NO ANSWER, MESSAGE LEFT FOR PATIENT. 

## 2021-08-28 IMAGING — DX DG CHEST 2V
2 series · 2 of 2 positions shown · non-contrast
Comparison: June 21, 2013.

CLINICAL DATA: Shortness of breath.

EXAM:
CHEST - 2 VIEW

[chest pa]
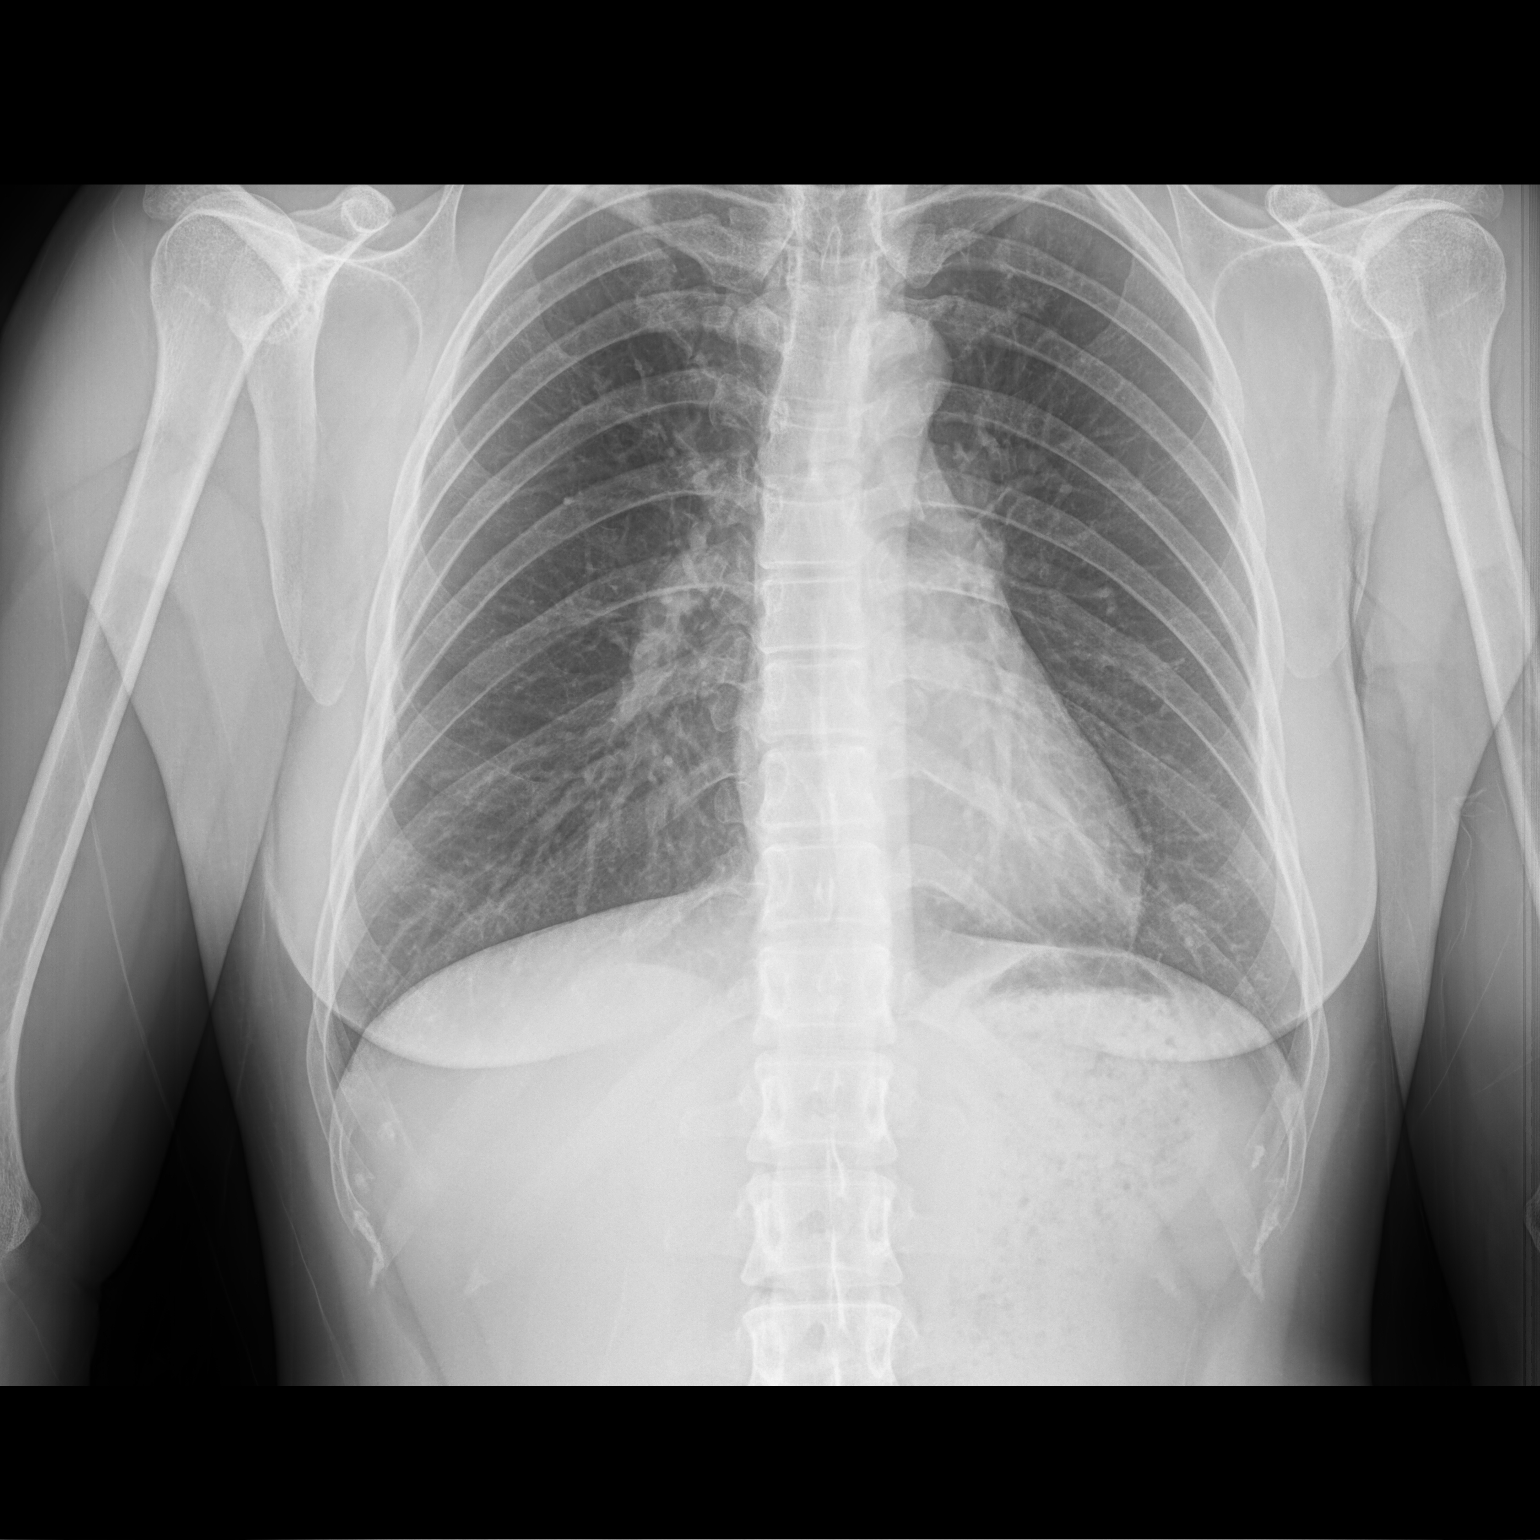

[chest lat]
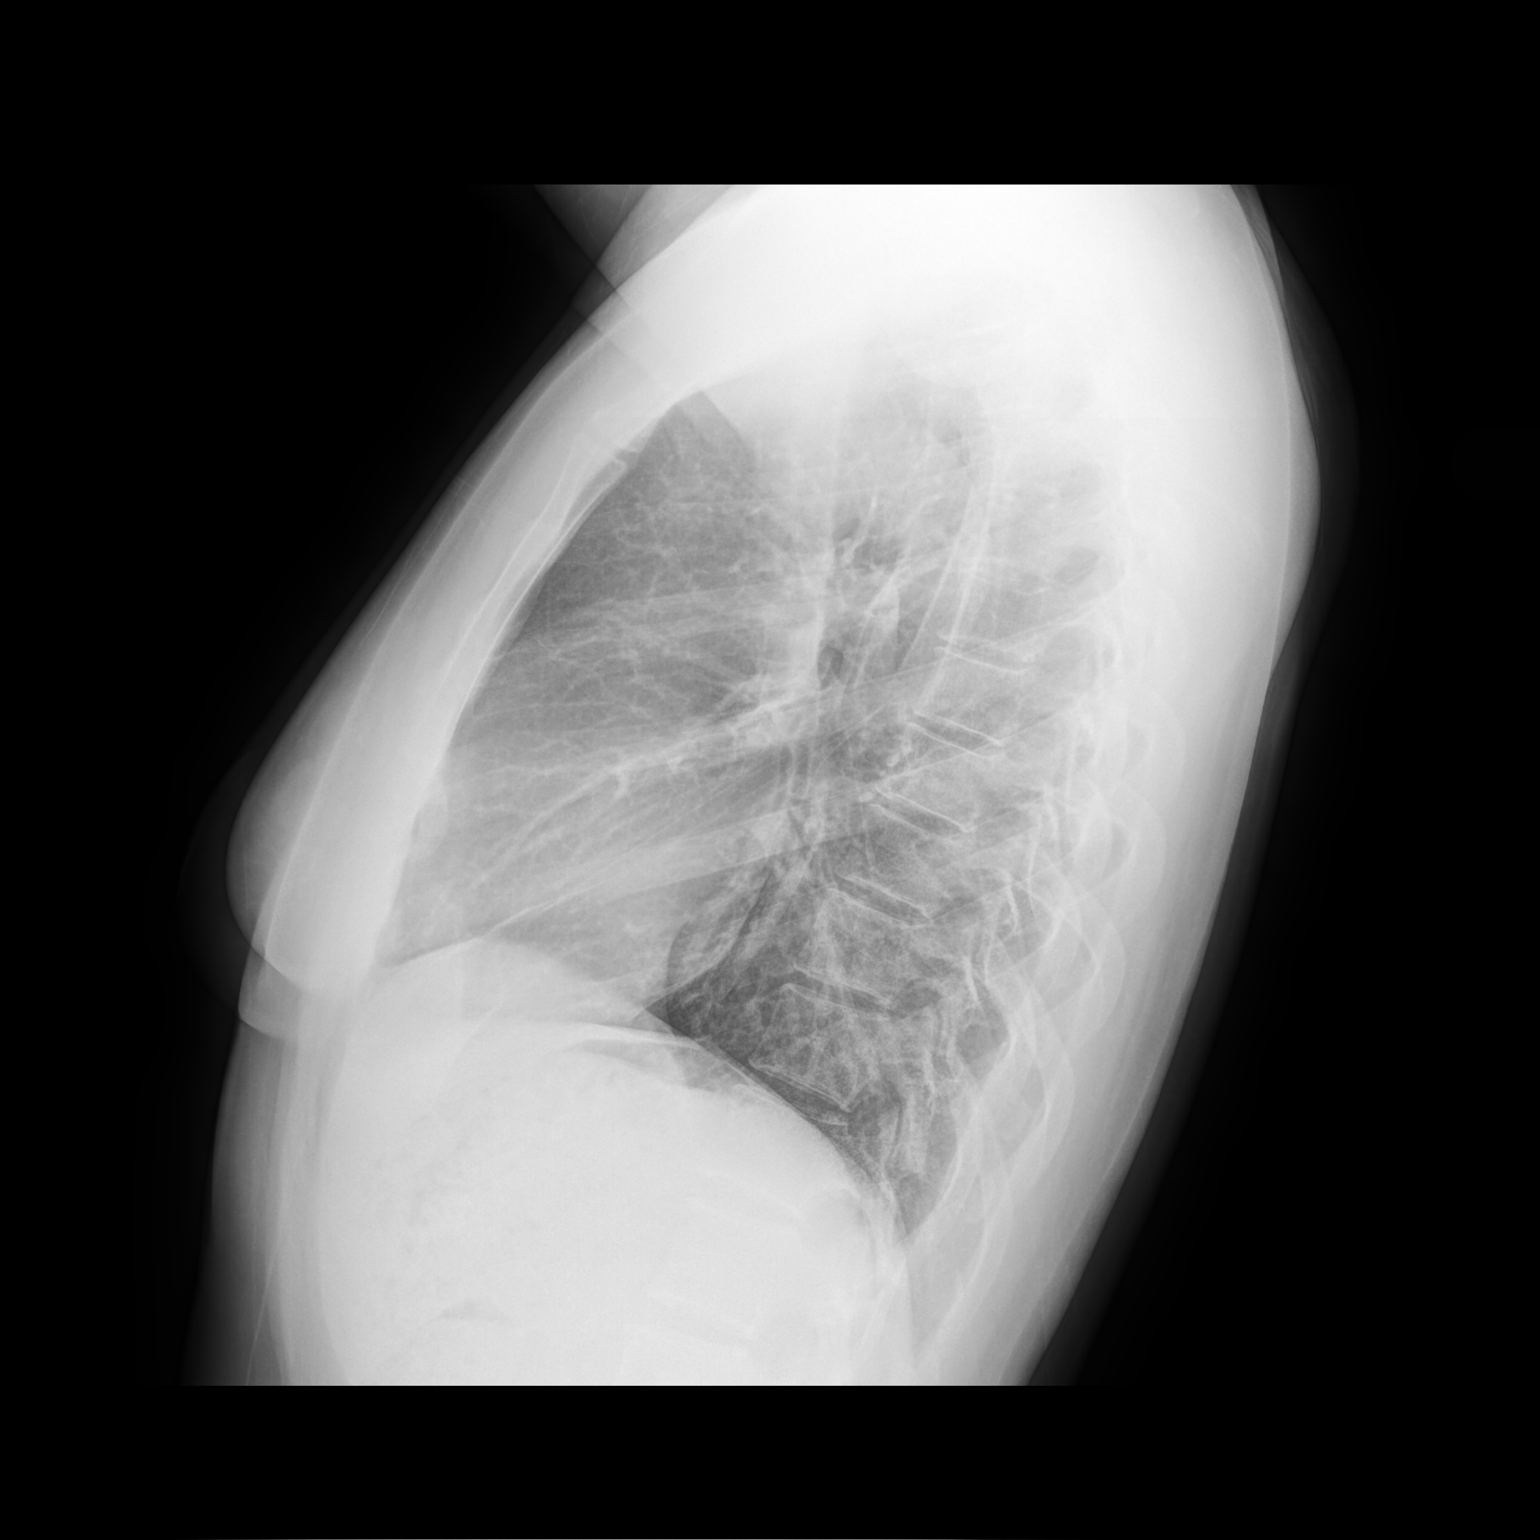

[2 of 2 positions shown; findings below may reference images not displayed]

FINDINGS: The heart size and mediastinal contours are within normal limits.
Both lungs are clear. No visible pleural effusions or pneumothorax.
No acute osseous abnormality.
IMPRESSION: No active cardiopulmonary disease.

## 2021-11-19 ENCOUNTER — Telehealth: Payer: Self-pay | Admitting: Gastroenterology

## 2021-11-19 NOTE — Telephone Encounter (Signed)
Inbound call from patient states she is bloating very uncomfortably and would like medication to help with bloating or advise on what to do

## 2021-11-19 NOTE — Telephone Encounter (Signed)
Left message for pt to call back  °

## 2021-11-20 NOTE — Telephone Encounter (Signed)
Lm on vm for patient to return call 

## 2021-11-22 NOTE — Telephone Encounter (Signed)
3rd attempt to reach pt. Lm on vm for patient to return call. 

## 2021-11-27 NOTE — Telephone Encounter (Signed)
No return call received. Will await further communication from patient.  

## 2021-12-31 ENCOUNTER — Telehealth: Payer: Self-pay

## 2021-12-31 NOTE — Telephone Encounter (Signed)
Rec'd a fax from [facility] for refill of buspirone.  Called patient.  LM to call to discuss. This medication is typically managed by Dr. Dimas Chyle.  Asked her to let us know if she would like for [place] to change her pharmacy to [facility] and is she wanting Dr. Havery Moros to mange this? Sent fax back to [facility] indicating that Dr. Jerline Pain has managed this medication in the past.

## 2022-02-10 ENCOUNTER — Telehealth: Payer: Self-pay | Admitting: Physician Assistant

## 2022-02-10 NOTE — Telephone Encounter (Signed)
Left voicemail for patient to call and advise if she had taken Linzess 145 mcg. ?

## 2022-02-10 NOTE — Telephone Encounter (Signed)
Please advise 

## 2022-02-10 NOTE — Telephone Encounter (Signed)
Patient calling requesting alternative med for Trulance. Insurance does NOT cover this medication.. thank you  ?

## 2022-02-18 MED ORDER — LINACLOTIDE 145 MCG PO CAPS: 145.0000 ug | ORAL_CAPSULE | Freq: Every day | ORAL | 3 refills | Status: DC

## 2022-02-18 NOTE — Telephone Encounter (Signed)
Patient has returned phone call. Discussed switching to Linzess. Linzess has been sent to patient's pharmacy. ?

## 2022-02-24 ENCOUNTER — Telehealth: Payer: Self-pay | Admitting: Gastroenterology

## 2022-02-24 NOTE — Telephone Encounter (Signed)
Patient called stating she is cannot afford Linzess medication and would like to find an alternative. Please advise ?

## 2022-02-24 NOTE — Telephone Encounter (Signed)
See telephone note from 3-13. ?

## 2022-02-24 NOTE — Telephone Encounter (Signed)
Patient called and indicated that Linzess is too expensive and is looking for an alternative.  Called patient back and LM that she may want to call her insurance provider and ask which medication that cover.  She has tired Trulance. ?

## 2022-02-25 MED ORDER — LUBIPROSTONE 8 MCG PO CAPS: 8.0000 ug | ORAL_CAPSULE | Freq: Two times a day (BID) | ORAL | 3 refills | Status: AC

## 2022-02-25 NOTE — Telephone Encounter (Signed)
LM for patient

## 2022-02-25 NOTE — Telephone Encounter (Signed)
Script sent to pharmacy. Will see if covered better by her insurance ?

## 2022-02-25 NOTE — Addendum Note (Signed)
Addended by: Roetta Sessions on: 02/25/2022 09:28 AM ? ? Modules accepted: Orders ? ?

## 2022-03-26 ENCOUNTER — Telehealth: Payer: Self-pay | Admitting: Family Medicine

## 2022-03-27 ENCOUNTER — Telehealth (INDEPENDENT_AMBULATORY_CARE_PROVIDER_SITE_OTHER): Payer: 59 | Admitting: Family Medicine

## 2022-03-27 ENCOUNTER — Encounter: Payer: Self-pay | Admitting: Family Medicine

## 2022-03-27 VITALS — Ht 61.0 in | Wt 140.0 lb

## 2022-03-27 DIAGNOSIS — F419 Anxiety disorder, unspecified: Secondary | ICD-10-CM

## 2022-03-27 MED ORDER — BUSPIRONE HCL 7.5 MG PO TABS: 15.0000 mg | ORAL_TABLET | Freq: Two times a day (BID) | ORAL | 2 refills | Status: AC

## 2022-03-27 NOTE — Progress Notes (Signed)
? ?  Brenda Krause is a 36 y.o. female who presents today for a virtual office visit. ? ?Assessment/Plan:  ? ?Chronic Problems Addressed Today: ?Anxiety ?Symptoms are not well controlled.  She is taking BuSpar 7.5 mg twice daily.  We can increase to 15 mg twice daily though patient is hesitant to make any dose changes at this point.  She will try taking 2 pills twice daily to see if this goes.  We also discussed referral for her to see a therapist.  She is possibly interested in this however does not wish to be referred at this time.  Her children see a few therapist and she would like to potentially establish care with one of her children's therapist due to already having relationship.  She will let me know when she would like to have a referral placed. ? ?She will follow-up in a couple weeks via MyChart. ? ? ?  ?Subjective:  ?HPI: ? ?See A/P for status of chronic conditions.  She needs refill on her BuSpar today.  She has been taking this for several years.  Seems to be working well though she does notice that some days anxiety is worse than others.  She still has a lot of issues with racing thoughts and difficulty relaxing.  No reported SI or HI.  She is doing well otherwise today. ? ?   ?  ?Objective/Observations  ?Physical Exam: ?Gen: NAD, resting comfortably ?Pulm: Normal work of breathing ?Neuro: Grossly normal, moves all extremities ?Psych: Normal affect and thought content ? ?Virtual Visit via Video  ? ?I connected with [name] on 03/27/22 at  1:40 PM EDT by a video enabled telemedicine application and verified that I am speaking with the correct person using two identifiers. The limitations of evaluation and management by telemedicine and the availability of in person appointments were discussed. The patient expressed understanding and agreed to proceed.  ? ?Patient location: Home ?Provider location: [place] ?Persons participating in the virtual visit: Myself and Patient ?    ? ?Algis Greenhouse. Jerline Pain, MD ?03/27/2022 2:10 PM  ?

## 2022-03-27 NOTE — Assessment & Plan Note (Addendum)
Symptoms are not well controlled.  She is taking BuSpar 7.5 mg twice daily.  We can increase to 15 mg twice daily though patient is hesitant to make any dose changes at this point.  She will try taking 2 pills twice daily to see if this goes.  We also discussed referral for her to see a therapist.  She is possibly interested in this however does not wish to be referred at this time.  Her children see a few therapist and she would like to potentially establish care with one of her children's therapist due to already having relationship.  She will let me know when she would like to have a referral placed. ? ?She will follow-up in a couple weeks via MyChart. ?

## 2022-03-31 NOTE — Telephone Encounter (Addendum)
Regarding:  ?busPIRone (BUSPAR) 7.5 MG tablet [[id number]]  ? ? ?Pt states the medication works at night, but is not consistent during the day. Pt wonders if it is no longer effective. ? ?Pt would like a change. ? ?Does pt need an office visit to manage this medication? ?If so, is Virtual ok? ? ?Route back to front desk for scheduling, as needed. ?Otherwise, please reach out to patient. ?

## 2022-04-01 NOTE — Telephone Encounter (Signed)
Left vm to schedule appt

## 2022-04-11 ENCOUNTER — Telehealth (INDEPENDENT_AMBULATORY_CARE_PROVIDER_SITE_OTHER): Payer: 59 | Admitting: Family Medicine

## 2022-04-11 ENCOUNTER — Encounter: Payer: Self-pay | Admitting: Family Medicine

## 2022-04-11 VITALS — Ht 61.0 in | Wt 140.0 lb

## 2022-04-11 DIAGNOSIS — F419 Anxiety disorder, unspecified: Secondary | ICD-10-CM | POA: Diagnosis not present

## 2022-04-11 MED ORDER — FLUOXETINE HCL 20 MG PO CAPS: 20.0000 mg | ORAL_CAPSULE | Freq: Every day | ORAL | 3 refills | Status: AC

## 2022-04-11 NOTE — Assessment & Plan Note (Signed)
Symptoms are still not adequately controlled.  She is on BuSpar 7.5 mg twice daily.  She is interested in starting Prozac.  I think this would be a reasonable option.  We will start 20 mg daily.  We discussed potential side effects.  She will follow-up in a couple weeks via MyChart and we can titrate the dose as needed. ?

## 2022-04-11 NOTE — Progress Notes (Signed)
? ?  Brenda Krause is a 37 y.o. female who presents today for a virtual office visit. ? ?Assessment/Plan:  ?Chronic Problems Addressed Today: ?Anxiety ?Symptoms are still not adequately controlled.  She is on BuSpar 7.5 mg twice daily.  She is interested in starting Prozac.  I think this would be a reasonable option.  We will start 20 mg daily.  We discussed potential side effects.  She will follow-up in a couple weeks via MyChart and we can titrate the dose as needed. ? ? ?  ?Subjective:  ?HPI: ? ?See A/p for status of chronic conditions. She is here to follow up on her anxiety. We last saw her a few weeks ago and refilled her buspar. She feels like this works well at night though still has a significant amount of symptoms during the day.  She feels excessively worried and this seems to be worsening. Her children do well with prozac and she would like try to this as well. No reported SI or HI.  ? ?   ?  ?Objective/Observations  ?Physical Exam: ?Gen: NAD, resting comfortably ?Pulm: Normal work of breathing ?Neuro: Grossly normal, moves all extremities ?Psych: Normal affect and thought content ? ?Virtual Visit via Video  ? ?I connected with Brenda Krause on 04/11/22 at  8:40 AM EDT by a video enabled telemedicine application and verified that I am speaking with the correct person using two identifiers. The limitations of evaluation and management by telemedicine and the availability of in person appointments were discussed. The patient expressed understanding and agreed to proceed.  ? ?Patient location: Home ?Provider location: [place] ?Persons participating in the virtual visit: Myself and Patient ?   ? ?Algis Greenhouse. Jerline Pain, MD ?04/11/2022 9:12 AM  ?

## 2022-04-29 ENCOUNTER — Telehealth (INDEPENDENT_AMBULATORY_CARE_PROVIDER_SITE_OTHER): Payer: 59 | Admitting: Family Medicine

## 2022-04-29 DIAGNOSIS — F419 Anxiety disorder, unspecified: Secondary | ICD-10-CM

## 2022-04-29 MED ORDER — DIAZEPAM 2 MG PO TABS: 2.0000 mg | ORAL_TABLET | Freq: Four times a day (QID) | ORAL | 0 refills | Status: AC | PRN

## 2022-04-29 NOTE — Assessment & Plan Note (Signed)
Symptoms have not had much change since we started Prozac 18 days ago.  She has been taking sporadically.  Advised patient she should take this daily.  She voiced understanding.  She is having some nausea but hopefully this will improve with a more consistent dosing.  She does have an upcoming flight that she is anxious about.  She usually has preflight anxiety.  She asked about use of as needed medications.  We will send in a low-dose Valium 2 mg every 6 hours as needed.  We discussed potential side effects.  Advised patient she should only uses very sparingly as needed if she is going into an anxiety provoking situation but hopefully with a more consistent dose of Prozac she will do better with this.  She will follow-up with me in a few weeks via MyChart.

## 2022-04-29 NOTE — Progress Notes (Signed)
   Brenda Krause is a 36 y.o. female who presents today for a virtual office visit.  Assessment/Plan:  Chronic Problems Addressed Today: Anxiety Symptoms have not had much change since we started Prozac 18 days ago.  She has been taking sporadically.  Advised patient she should take this daily.  She voiced understanding.  She is having some nausea but hopefully this will improve with a more consistent dosing.  She does have an upcoming flight that she is anxious about.  She usually has preflight anxiety.  She asked about use of as needed medications.  We will send in a low-dose Valium 2 mg every 6 hours as needed.  We discussed potential side effects.  Advised patient she should only uses very sparingly as needed if she is going into an anxiety provoking situation but hopefully with a more consistent dose of Prozac she will do better with this.  She will follow-up with me in a few weeks via MyChart.     Subjective:  HPI:  See A/P for status of chronic conditions.       Objective/Observations  Physical Exam: Gen: NAD, resting comfortably Pulm: Normal work of breathing Neuro: Grossly normal, moves all extremities Psych: Normal affect and thought content  Virtual Visit via Video   I connected with Brenda Krause on 04/29/22 at 11:00 AM EDT by a video enabled telemedicine application and verified that I am speaking with the correct person using two identifiers. The limitations of evaluation and management by telemedicine and the availability of in person appointments were discussed. The patient expressed understanding and agreed to proceed.   Patient location: Home Provider location: [place] participating in the virtual visit: Myself and Patient     Algis Greenhouse. Jerline Pain, MD 04/29/2022 11:28 AM

## 2022-08-25 ENCOUNTER — Encounter: Payer: Self-pay | Admitting: *Deleted

## 2022-11-13 ENCOUNTER — Encounter: Payer: Self-pay | Admitting: *Deleted

## 2023-05-05 ENCOUNTER — Ambulatory Visit: Payer: 59 | Admitting: Family Medicine
